# Patient Record
Sex: Female | Born: 1980 | Race: White | Hispanic: No | Marital: Married | Smoking: Never smoker
Health system: Southern US, Community
[De-identification: ages and names within clinical notes are randomized; demographics above are authoritative.]

## PROBLEM LIST (undated history)

## (undated) DIAGNOSIS — N859 Noninflammatory disorder of uterus, unspecified: Secondary | ICD-10-CM

## (undated) DIAGNOSIS — R000 Tachycardia, unspecified: Secondary | ICD-10-CM

## (undated) DIAGNOSIS — N809 Endometriosis, unspecified: Secondary | ICD-10-CM

## (undated) DIAGNOSIS — R519 Headache, unspecified: Secondary | ICD-10-CM

## (undated) DIAGNOSIS — E282 Polycystic ovarian syndrome: Secondary | ICD-10-CM

## (undated) DIAGNOSIS — R51 Headache: Secondary | ICD-10-CM

## (undated) DIAGNOSIS — E0590 Thyrotoxicosis, unspecified without thyrotoxic crisis or storm: Secondary | ICD-10-CM

## (undated) DIAGNOSIS — O24419 Gestational diabetes mellitus in pregnancy, unspecified control: Secondary | ICD-10-CM

## (undated) DIAGNOSIS — Z9109 Other allergy status, other than to drugs and biological substances: Secondary | ICD-10-CM

## (undated) DIAGNOSIS — N949 Unspecified condition associated with female genital organs and menstrual cycle: Secondary | ICD-10-CM

## (undated) DIAGNOSIS — O139 Gestational [pregnancy-induced] hypertension without significant proteinuria, unspecified trimester: Secondary | ICD-10-CM

## (undated) HISTORY — DX: Gestational (pregnancy-induced) hypertension without significant proteinuria, unspecified trimester: O13.9

## (undated) HISTORY — PX: CHOLECYSTECTOMY: SHX55

## (undated) HISTORY — PX: LAPAROSCOPIC ENDOMETRIOSIS FULGURATION: SUR769

## (undated) HISTORY — DX: Gestational diabetes mellitus in pregnancy, unspecified control: O24.419

## (undated) HISTORY — DX: Endometriosis, unspecified: N80.9

## (undated) HISTORY — DX: Polycystic ovarian syndrome: E28.2

## (undated) HISTORY — DX: Thyrotoxicosis, unspecified without thyrotoxic crisis or storm: E05.90

## (undated) HISTORY — PX: WISDOM TOOTH EXTRACTION: SHX21

## (undated) HISTORY — DX: Other allergy status, other than to drugs and biological substances: Z91.09

---
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
## 2007-02-22 ENCOUNTER — Ambulatory Visit: Payer: Self-pay | Admitting: Family Medicine

## 2007-12-25 ENCOUNTER — Ambulatory Visit: Payer: Self-pay | Admitting: Internal Medicine

## 2008-08-02 ENCOUNTER — Ambulatory Visit: Payer: Self-pay | Admitting: Internal Medicine

## 2010-05-02 ENCOUNTER — Ambulatory Visit: Payer: Self-pay | Admitting: Internal Medicine

## 2010-11-24 NOTE — Assessment & Plan Note (Signed)
[facility]                            CARDIOLOGY OFFICE NOTE   NAME:BLACKWELL, RASHELLE                    MRN:          [id number]  DATE:08/02/2008                            DOB:          [birth year]    IDENTIFICATION:  Ms. Amie Critchley is a 30 year old woman with a history of  palpitations, question SVT, also history of migraine headaches.  I last  saw her in June.   The patient since then has done well.  She notes occasional palpitations  that are very short lived, no associated dizziness.  She is active,  notes no problems with this.  No episodes of syncope.   CURRENT MEDICATIONS:  1. Topamax 300 daily, 400 around the time of her menses.  2. Zyrtec daily.  3. Yasmin daily.   PHYSICAL EXAMINATION:  GENERAL:  On exam, the patient is in no distress.  VITAL SIGNS:  Blood pressure lying 108/72, pulse 68; sitting 114/77,  pulse 72; and standing 0 minutes 109/76, pulse 74; at 2 minutes 107/73,  pulse 74; 5 minutes 105/73 pulse 82.  The patient asymptomatic.  LUNGS:  Clear.  NECK:  JVP is normal.  CARDIAC:  Regular rate and rhythm, S1 and S2.  No S3, S4, or murmurs.  ABDOMEN:  Benign.  EXTREMITIES:  No edema.   IMPRESSION:  1. Palpitations may indeed be short burst of supraventricular      tachycardia.  Overall, she seems to be tolerated.  There was a      question one of the monitors of an SVT.  Still does not appear to      be destabilizing in anyway.  2. If her symptoms change, I will be happy to see her again.  We would      set her up for an event monitor if things get worse.  For now,      though I would continue on as she is doing.  I will set a followup      only as needed.  She will call.     Pricilla Riffle, MD, [facility]  Electronically Signed    PVR/MedQ  DD: 08/02/2008  DT: 08/03/2008  Job #: [id number]   cc:   Alvin Critchley, M.D.  Leim Fabry, MD

## 2010-11-24 NOTE — Assessment & Plan Note (Signed)
[facility]                            CARDIOLOGY OFFICE NOTE   NAME:BLACKWELL, BYANCA                    MRN:          [id number]  DATE:12/25/2007                            DOB:          1981-02-14    IDENTIFICATION:  [name] is a 30 year old woman whom I have seen  in the past.  She actually was previously followed by Lorna Few for  palpitations, question SVT. I last saw her back in 2003.   In the interval, [facility]  She was seen  by a PA of Dr. Onnie Boer and during this checkup was told she had an  irregular pulse and that she should follow up with a cardiologist. She  had follow up with a primary after this and was unable to hear the  irregularity, but again keeps her followup.   On talking to the patient, she still has spells that last very short,  about a minute.  Usually when she is changing positions, she will feel  her heart race.  Again, also when she is about to sneeze.  Less than a  minute. A couple months ago, she had one spell the last one longest  episode about 1-2 hours.  She felt tired after and a little dizzy.  She  tries to avoid caffeine.   CURRENT MEDICATIONS:  1. Topamax 300.  2. Zyrtec.  3. Yasmin contraceptive.  4. Singulair p.r.n.   PHYSICAL EXAM:  The patient is in no distress.  Blood pressure 125/73,  pulse 92 and regular, weight 122.  LUNGS:  Clear.  CARDIAC:  Regular rate and rhythm. S1-S2, no S3.  ABDOMEN: Abdomen is benign.  EXTREMITIES:  No edema.   IMPRESSION:  Tachycardia.  Review of Gar Ponto note, it was  difficult. She did not have a lot of strips to show the tachycardia.  It  was presumed SVT. She still has only occasional spells. I am not  convinced she is that symptomatic.  I have given her a prescription for  atenolol.  If she has a long spell she can take a half and then one.  Note, she is due to take a mission trip to [place].   Otherwise,  I do not think any other intervention is needed at present.  She will call if her symptoms worsen.  Otherwise, I will set to see her  back in the winter.   ADDENDUM:  12-lead EKG normal sinus rhythm 88 beats per minute.     Pricilla Riffle, MD, [facility]  Electronically Signed    PVR/MedQ  DD: 12/25/2007  DT: 12/25/2007  Job #: [id number]   cc:   Santiago Glad

## 2011-06-26 ENCOUNTER — Ambulatory Visit: Payer: Self-pay | Admitting: Internal Medicine

## 2014-02-03 ENCOUNTER — Ambulatory Visit: Payer: Self-pay | Admitting: Internal Medicine

## 2014-07-27 ENCOUNTER — Emergency Department: Payer: Self-pay | Admitting: Physician Assistant

## 2014-12-29 ENCOUNTER — Ambulatory Visit
Admission: EM | Admit: 2014-12-29 | Discharge: 2014-12-29 | Disposition: A | Discharge status: Home / Self Care | Payer: BLUE CROSS/BLUE SHIELD | Attending: Family Medicine | Admitting: Family Medicine

## 2014-12-29 ENCOUNTER — Encounter: Payer: Self-pay | Admitting: Emergency Medicine

## 2014-12-29 DIAGNOSIS — R110 Nausea: Secondary | ICD-10-CM | POA: Diagnosis not present

## 2014-12-29 DIAGNOSIS — R197 Diarrhea, unspecified: Secondary | ICD-10-CM | POA: Diagnosis not present

## 2014-12-29 DIAGNOSIS — Z3201 Encounter for pregnancy test, result positive: Secondary | ICD-10-CM | POA: Insufficient documentation

## 2014-12-29 HISTORY — DX: Headache: R51

## 2014-12-29 HISTORY — DX: Unspecified condition associated with female genital organs and menstrual cycle: N94.9

## 2014-12-29 HISTORY — DX: Headache, unspecified: R51.9

## 2014-12-29 HISTORY — DX: Tachycardia, unspecified: R00.0

## 2014-12-29 HISTORY — DX: Noninflammatory disorder of uterus, unspecified: N85.9

## 2014-12-29 LAB — CBC WITH DIFFERENTIAL/PLATELET
BASOS ABS: 0 10*3/uL (ref 0–0.1)
Basophils Relative: 1 %
EOS PCT: 2 %
Eosinophils Absolute: 0.2 10*3/uL (ref 0–0.7)
HCT: 42.4 % (ref 35.0–47.0)
Hemoglobin: 14.4 g/dL (ref 12.0–16.0)
LYMPHS ABS: 2.7 10*3/uL (ref 1.0–3.6)
LYMPHS PCT: 27 %
MCH: 29.4 pg (ref 26.0–34.0)
MCHC: 34 g/dL (ref 32.0–36.0)
MCV: 86.3 fL (ref 80.0–100.0)
Monocytes Absolute: 0.6 10*3/uL (ref 0.2–0.9)
Monocytes Relative: 6 %
Neutro Abs: 6.5 10*3/uL (ref 1.4–6.5)
Neutrophils Relative %: 64 %
Platelets: 200 10*3/uL (ref 150–440)
RBC: 4.91 MIL/uL (ref 3.80–5.20)
RDW: 12.5 % (ref 11.5–14.5)
WBC: 10 10*3/uL (ref 3.6–11.0)

## 2014-12-29 LAB — URINALYSIS COMPLETE WITH MICROSCOPIC (ARMC ONLY)
BILIRUBIN URINE: NEGATIVE
Bacteria, UA: NONE SEEN — AB
Glucose, UA: NEGATIVE mg/dL
KETONES UR: NEGATIVE mg/dL
Leukocytes, UA: NEGATIVE
NITRITE: NEGATIVE
PROTEIN: NEGATIVE mg/dL
RBC / HPF: NONE SEEN RBC/hpf (ref <3)
Specific Gravity, Urine: 1.01 (ref 1.005–1.030)
pH: 5.5 (ref 5.0–8.0)

## 2014-12-29 LAB — BASIC METABOLIC PANEL
ANION GAP: 8 (ref 5–15)
BUN: 7 mg/dL (ref 6–20)
CO2: 26 mmol/L (ref 22–32)
CREATININE: 0.75 mg/dL (ref 0.44–1.00)
Calcium: 9.1 mg/dL (ref 8.9–10.3)
Chloride: 104 mmol/L (ref 101–111)
GLUCOSE: 96 mg/dL (ref 65–99)
Potassium: 3.9 mmol/L (ref 3.5–5.1)
Sodium: 138 mmol/L (ref 135–145)

## 2014-12-29 LAB — HCG, QUANTITATIVE, PREGNANCY: hCG, Beta Chain, Quant, S: 6 m[IU]/mL — ABNORMAL HIGH (ref <5)

## 2014-12-29 MED ORDER — ONDANSETRON 8 MG PO TBDP
8.0000 mg | ORAL_TABLET | Freq: Once | ORAL | Status: AC
  Administered 2014-12-29: 8 mg via ORAL

## 2014-12-29 MED ORDER — DOXYLAMINE-PYRIDOXINE 10-10 MG PO TBEC: 2.0000 | DELAYED_RELEASE_TABLET | Freq: Every evening | ORAL | Status: DC | PRN

## 2014-12-29 NOTE — ED Notes (Signed)
Diarrhea,  Gas, nauseated, headache since Wednesday.

## 2014-12-29 NOTE — Discharge Instructions (Signed)
Eating Plan for Hyperemesis Gravidarum  Severe cases of hyperemesis gravidarum can lead to dehydration and malnutrition. The hyperemesis eating plan is one way to lessen the symptoms of nausea and vomiting. It is often used with prescribed medicines to control your symptoms.   WHAT CAN I DO TO RELIEVE MY SYMPTOMS?  Listen to your body. Everyone is different and has different preferences. Find what works best for you. Some of the following things may help:  · Eat and drink slowly.  · Eat 5-6 small meals daily instead of 3 large meals.    · Eat crackers before you get out of bed in the morning.    · Starchy foods are usually well tolerated (such as cereal, toast, bread, potatoes, pasta, rice, and pretzels).    · Ginger may help with nausea. Add ¼ tsp ground ginger to hot tea or choose ginger tea.    · Try drinking 100% fruit juice or an electrolyte drink.  · Continue to take your prenatal vitamins as directed by your health care provider. If you are having trouble taking your prenatal vitamins, talk with your health care provider about different options.  · Include at least 1 serving of protein with your meals and snacks (such as meats or poultry, beans, nuts, eggs, or yogurt). Try eating a protein-rich snack before bed (such as cheese and crackers or a half turkey or peanut butter sandwich).  WHAT THINGS SHOULD I AVOID TO REDUCE MY SYMPTOMS?  The following things may help reduce your symptoms:  · Avoid foods with strong smells. Try eating meals in well-ventilated areas that are free of odors.  · Avoid drinking water or other beverages with meals. Try not to drink anything less than 30 minutes before and after meals.  · Avoid drinking more than 1 cup of fluid at a time.  · Avoid fried or high-fat foods, such as butter and cream sauces.  · Avoid spicy foods.  · Avoid skipping meals the best you can. Nausea can be more intense on an empty stomach. If you cannot tolerate food at that time, do not force it. Try sucking on  ice chips or other frozen items and make up the calories later.  · Avoid lying down within 2 hours after eating.  Document Released: 04/25/2007 Document Revised: 07/03/2013 Document Reviewed: 05/02/2013  Exit[employer]® Patient Information ©2015 [employer]. This information is not intended to replace advice given to you by your health care provider. Make sure you discuss any questions you have with your health care provider.

## 2014-12-29 NOTE — ED Provider Notes (Signed)
CSN: [id number]     Arrival date & time 12/29/14  0802 History   None    Chief Complaint  Patient presents with  . Nausea   (Consider location/radiation/quality/duration/timing/severity/associated sxs/prior Treatment) HPI Developed nausea after lunch Wed (4days ago) -no coworkers sick- on and off over last 4 days. Often starts day out feeling well and develops diarrhea or nausea as day progresses. Tolerated soup well on Friday night. Went out for salads last night and developed gas cramps and diarrhea again during night. Denies any fever. Mild malaise and fatigue. Took BCP break a few weeks ago, lag in Rx., no back up. Hx of continual dosing and no periods. Has started back. Reports using back up protection during interim only. Seasonal allegies - also not taking her Zyrtec right now  Past Medical History  Diagnosis Date  . Endometrial disorder   . Tachycardia   . Headache    Past Surgical History  Procedure Laterality Date  . Laparoscopic endometriosis fulguration     History reviewed. No pertinent family history. History  Substance Use Topics  . Smoking status: Never Smoker   . Smokeless tobacco: Never Used  . Alcohol Use: No   OB History    No data available     Review of Systems Review of 10 systems negative for acute change except as referenced in HPI  Allergies  Decongest-aid and Codeine  Home Medications   Prior to Admission medications   Medication Sig Start Date End Date Taking? Authorizing Provider  cetirizine (ZYRTEC) 10 MG tablet Take 10 mg by mouth daily.   Yes Historical Provider, MD  drospirenone-ethinyl estradiol (YASMIN,ZARAH,SYEDA) 3-0.03 MG tablet Take 1 tablet by mouth daily.   Yes Historical Provider, MD  naproxen (NAPROSYN) 250 MG tablet Take 250 mg by mouth 2 (two) times daily with a meal.   Yes Historical Provider, MD  Doxylamine-Pyridoxine 10-10 MG TBEC Take 2 tablets by mouth at bedtime as needed and may repeat dose one time if needed. 12/29/14    Rae Halsted, PA-C   BP 120/89 mmHg  Pulse 86  Temp(Src) 98.3 F (36.8 C) (Oral)  Resp 16  Ht 5\' 3"  (1.6 m)  Wt 140 lb (63.504 kg)  BMI 24.81 kg/m2  SpO2 100%  LMP 12/04/2014 Physical Exam Constitutional -alert and oriented,well appearing, quiqet- no acute distress Head-atraumatic Eyes- conjunctiva normal, EOMI ,conjugate gaze Ears- canasl and TMs neg Nose- no congestion or rhinorrhea Mouth/throat- mucous membranes moist  Neck- supple without glandular enlargement CV- regular rate, grossly normal heart sounds,  Resp-no distress, normal respiratory effort,clear to auscultation bilaterally GI- soft,non-tender,no distention, increased bowel activity GU- not examined MSK- no lower extremity tenderness nor edema,no joint effusion,  Neuro- normal speech and language,  Skin-warm,dry ,intact; no rash noted Psych-mood and affect grossly normal; speech and behavior grossly normal ED Course  Procedures (including critical care time) Labs Review Labs Reviewed  URINALYSIS COMPLETEWITH MICROSCOPIC ([facility] ONLY) - Abnormal; Notable for the following:    Hgb urine dipstick TRACE (*)    Bacteria, UA NONE SEEN (*)    Squamous Epithelial / LPF 6-30 (*)    All other components within normal limits  HCG, QUANTITATIVE, PREGNANCY - Abnormal; Notable for the following:    hCG, Beta Chain, Quant, S 6 (*)    All other components within normal limits  BASIC METABOLIC PANEL  CBC WITH DIFFERENTIAL/PLATELET   Results for orders placed or performed during the hospital encounter of 12/29/14  Urinalysis complete, with microscopic  Result  Value Ref Range   Color, Urine YELLOW YELLOW   APPearance CLEAR CLEAR   Glucose, UA NEGATIVE NEGATIVE mg/dL   Bilirubin Urine NEGATIVE NEGATIVE   Ketones, ur NEGATIVE NEGATIVE mg/dL   Specific Gravity, Urine 1.010 1.005 - 1.030   Hgb urine dipstick TRACE (A) NEGATIVE   pH 5.5 5.0 - 8.0   Protein, ur NEGATIVE NEGATIVE mg/dL   Nitrite NEGATIVE NEGATIVE    Leukocytes, UA NEGATIVE NEGATIVE   RBC / HPF NONE SEEN <3 RBC/hpf   WBC, UA 0-5 <3 WBC/hpf   Bacteria, UA NONE SEEN (A) RARE   Squamous Epithelial / LPF 6-30 (A) RARE  hCG, quantitative, pregnancy  Result Value Ref Range   hCG, Beta Chain, Quant, S 6 (H) <5 mIU/mL  Basic metabolic panel  Result Value Ref Range   Sodium 138 135 - 145 mmol/L   Potassium 3.9 3.5 - 5.1 mmol/L   Chloride 104 101 - 111 mmol/L   CO2 26 22 - 32 mmol/L   Glucose, Bld 96 65 - 99 mg/dL   BUN 7 6 - 20 mg/dL   Creatinine, Ser 1.61 0.44 - 1.00 mg/dL   Calcium 9.1 8.9 - 09.6 mg/dL   GFR calc non Af Amer >60 >60 mL/min   GFR calc Af Amer >60 >60 mL/min   Anion gap 8 5 - 15  CBC with Differential  Result Value Ref Range   WBC 10.0 3.6 - 11.0 K/uL   RBC 4.91 3.80 - 5.20 MIL/uL   Hemoglobin 14.4 12.0 - 16.0 g/dL   HCT 04.5 40.9 - 81.1 %   MCV 86.3 80.0 - 100.0 fL   MCH 29.4 26.0 - 34.0 pg   MCHC 34.0 32.0 - 36.0 g/dL   RDW 91.4 78.2 - 95.6 %   Platelets 200 150 - 440 K/uL   Neutrophils Relative % 64 %   Neutro Abs 6.5 1.4 - 6.5 K/uL   Lymphocytes Relative 27 %   Lymphs Abs 2.7 1.0 - 3.6 K/uL   Monocytes Relative 6 %   Monocytes Absolute 0.6 0.2 - 0.9 K/uL   Eosinophils Relative 2 %   Eosinophils Absolute 0.2 0 - 0.7 K/uL   Basophils Relative 1 %   Basophils Absolute 0.0 0 - 0.1 K/uL   Imaging Review No results found.  Medications  ondansetron (ZOFRAN-ODT) disintegrating tablet 8 mg (8 mg Oral Given 12/29/14 0945)  Relief of nausea experienced MDM   1. Nausea   2. Diarrhea    Very early positive pregnancy test discussed with patient and her husband. They  are astounded and can't identify when they were together without protection. Recommend a 3 day repeat BHCG for confirmation. Reevalauate GI distress at that time. Pregnancy precautions- no medication, alcohol, drugs,saunas. Discharge Medication List as of 12/29/2014 10:39 AM    START taking these medications   Details  Doxylamine-Pyridoxine  10-10 MG TBEC Take 2 tablets by mouth at bedtime as needed and may repeat dose one time if needed., Starting 12/29/2014, Until Discontinued, Print      Diagnosis and treatment discussed. . Questions fielded, expectations and recommendations reviewed. Patient expresses understanding. Will return to [facility] with questions, concern or exacerbation. Has Rx for nausea and dosing schedule reviewed. Will see OB-Gyn in next few days   Rae Halsted, PA-C 12/31/14 0001

## 2014-12-30 ENCOUNTER — Encounter: Payer: Self-pay | Admitting: Physician Assistant

## 2016-03-17 ENCOUNTER — Ambulatory Visit (INDEPENDENT_AMBULATORY_CARE_PROVIDER_SITE_OTHER): Payer: BLUE CROSS/BLUE SHIELD | Admitting: Obstetrics and Gynecology

## 2016-03-17 ENCOUNTER — Encounter: Payer: Self-pay | Admitting: Obstetrics and Gynecology

## 2016-03-17 VITALS — BP 136/88 | HR 84 | Ht 63.0 in | Wt 151.4 lb

## 2016-03-17 DIAGNOSIS — N809 Endometriosis, unspecified: Secondary | ICD-10-CM | POA: Insufficient documentation

## 2016-03-17 DIAGNOSIS — N970 Female infertility associated with anovulation: Secondary | ICD-10-CM | POA: Diagnosis not present

## 2016-03-17 DIAGNOSIS — L709 Acne, unspecified: Secondary | ICD-10-CM | POA: Diagnosis not present

## 2016-03-17 DIAGNOSIS — E282 Polycystic ovarian syndrome: Secondary | ICD-10-CM

## 2016-03-17 DIAGNOSIS — J309 Allergic rhinitis, unspecified: Secondary | ICD-10-CM | POA: Insufficient documentation

## 2016-03-17 DIAGNOSIS — N803 Endometriosis of pelvic peritoneum: Secondary | ICD-10-CM | POA: Insufficient documentation

## 2016-03-17 DIAGNOSIS — Z0000 Encounter for general adult medical examination without abnormal findings: Secondary | ICD-10-CM

## 2016-03-17 DIAGNOSIS — F419 Anxiety disorder, unspecified: Secondary | ICD-10-CM

## 2016-03-17 DIAGNOSIS — IMO0002 Reserved for concepts with insufficient information to code with codable children: Secondary | ICD-10-CM | POA: Insufficient documentation

## 2016-03-17 DIAGNOSIS — Z01419 Encounter for gynecological examination (general) (routine) without abnormal findings: Secondary | ICD-10-CM

## 2016-03-17 NOTE — Progress Notes (Signed)
ANNUAL PREVENTATIVE CARE GYN  ENCOUNTER NOTE  Subjective:       [facility] is a 35 y.o. G0P0 female here for a routine annual gynecologic exam.  Current complaints: 1.  Trying to get pregnant   Irregular cycles- infrequent  History of endometriosis diagnosed at age 35 via laparoscopy; Dr KinciusPerformed Ablation of Endometriosis Status post 6 months of Depo-Lupron therapy Status post years of cyclic birth control pill therapy Status post recent continuous OCP therapy for several years.  Oligomenorrhea since discontinuing OCPs in December 2016. 2 cycles since discontinuing OCPs. The patient has noticed increased acne, increased dryness of skin as well as some increased hair growth since stopping birth control pills.  Gynecologic History Patient's last menstrual period was 03/10/2016 (exact date). Contraception: none Last Pap: 01/2014 n/n. Results were: normal Last mammogram: n/a. Results were: n/a  Obstetric History OB History  Gravida Para Term Preterm AB Living  0            SAB TAB Ectopic Multiple Live Births                   Past Medical History:  Diagnosis Date  . Endometrial disorder   . Endometriosis   . Environmental allergies   . Headache   . Tachycardia     Past Surgical History:  Procedure Laterality Date  . LAPAROSCOPIC ENDOMETRIOSIS FULGURATION    . WISDOM TOOTH EXTRACTION      Current Outpatient Prescriptions on File Prior to Visit  Medication Sig Dispense Refill  . cetirizine (ZYRTEC) 10 MG tablet Take 10 mg by mouth daily.    . naproxen (NAPROSYN) 250 MG tablet Take 250 mg by mouth 2 (two) times daily with a meal.     No current facility-administered medications on file prior to visit.     Allergies  Allergen Reactions  . Decongest-Aid [Pseudoephedrine] Anaphylaxis    tachycardia  . Codeine Nausea And Vomiting    Social History   Social History  . Marital status: Married    Spouse name: N/A  . Number of children: N/A   . Years of education: N/A   Occupational History  . Not on file.   Social History Main Topics  . Smoking status: Never Smoker  . Smokeless tobacco: Never Used  . Alcohol use No  . Drug use: No  . Sexual activity: Yes    Birth control/ protection: Pill   Other Topics Concern  . Not on file   Social History Narrative  . No narrative on file    No family history on file.  The following portions of the patient's history were reviewed and updated as appropriate: allergies, current medications, past family history, past medical history, past social history, past surgical history and problem list.  Review of Systems ROS Review of Systems - General ROS: negative for - chills, fatigue, fever, hot flashes, night sweats, weight gain or weight loss Psychological ROS: negative for - anxiety, decreased libido, depression, mood swings, physical abuse or sexual abuse Ophthalmic ROS: negative for - blurry vision, eye pain or loss of vision ENT ROS: negative for - headaches, hearing change, visual changes or vocal changes Allergy and Immunology ROS: negative for - hives, itchy/watery eyes or seasonal allergies Hematological and Lymphatic ROS: negative for - bleeding problems, bruising, swollen lymph nodes or weight loss Endocrine ROS: negative for - galactorrhea, hair pattern changes, hot flashes, malaise/lethargy, mood swings, palpitations, polydipsia/polyuria, skin changes, temperature intolerance or unexpected weight changes Breast ROS: negative  for - new or changing breast lumps or nipple discharge Respiratory ROS: negative for - cough or shortness of breath Cardiovascular ROS: negative for - chest pain, irregular heartbeat, palpitations or shortness of breath Gastrointestinal ROS: no abdominal pain, change in bowel habits, or black or bloody stools Genito-Urinary ROS: no dysuria, trouble voiding, or hematuria Musculoskeletal ROS: negative for - joint pain or joint stiffness Neurological  ROS: negative for - bowel and bladder control changes Dermatological ROS: negative for rash and skin lesion changes   Objective:   BP 136/88   Pulse 84   Ht 5\' 3"  (1.6 m)   Wt 151 lb 6.4 oz (68.7 kg)   LMP 03/10/2016 (Exact Date)   BMI 26.82 kg/m  CONSTITUTIONAL: Well-developed, well-nourished female in no acute distress.  PSYCHIATRIC: Normal mood and affect. Normal behavior. Normal judgment and thought content. NEUROLGIC: Alert and oriented to person, place, and time. Normal muscle tone coordination. No cranial nerve deficit noted. HENT:  Normocephalic, atraumatic, External right and left ear normal. Oropharynx is clear and moist EYES: Conjunctivae and EOM are normal. Pupils are equal, round, and reactive to light. No scleral icterus.  NECK: Normal range of motion, supple, no masses.  Normal thyroid.  SKIN: Skin is warm and dry. No rash noted. Not diaphoretic. No erythema. No pallor. CARDIOVASCULAR: Normal heart rate noted, regular rhythm, no murmur. RESPIRATORY: Clear to auscultation bilaterally. Effort and breath sounds normal, no problems with respiration noted. BREASTS: Symmetric in size. No masses, skin changes, nipple drainage, or lymphadenopathy. ABDOMEN: Soft, normal bowel sounds, no distention noted.  No tenderness, rebound or guarding.  BLADDER: Normal PELVIC:  External Genitalia: Normal  BUS: Normal  Vagina: Normal  Cervix: Normal; nulliparous; no cervical motion tenderness  Uterus: Normal; midplane, nontender, mobile  Adnexa: Normal; nonpalpable nontender  RV: External Exam NormaI  MUSCULOSKELETAL: Normal range of motion. No tenderness.  No cyanosis, clubbing, or edema.  2+ distal pulses. LYMPHATIC: No Axillary, Supraclavicular, or Inguinal Adenopathy.  PH Q-9 questionnaire demonstrated mild anxiety symptoms  Assessment:   Annual gynecologic examination 35 y.o. 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 Anxiety  Plan:  Pap: due 2018 Mammogram: Not Indicated Stool Guaiac Testing:  Not Indicated Labs: thru pcp Routine preventative health maintenance measures emphasized: Exercise/Diet/Weight control, Tobacco Warnings, Alcohol/Substance use risks and Stress Management PH Q-9 questionnaire completed Recommend psychology counseling Laboratory work: Free and total testosterone, fasting blood sugar, hemoglobin A1c, lipid panel, vitamin D level, rubella, varicella, day 21 serum progesterone Return to clinic 1 week after lab work to be obtained on 04/01/2016 0.4 mg folic acid daily in multivitamin or prenatal vitamin Return to Clinic - 1 Year   Crystal Rhinecliff, CMA Herold Harms, MD  Note: This dictation was prepared with Dragon dictation along with smaller phrase technology. Any transcriptional errors that result from this process are unintentional.

## 2016-03-17 NOTE — Patient Instructions (Signed)
1. No Pap smear necessary 2.Self breast awareged 3. Healthy eating with exercis

## 2016-04-01 ENCOUNTER — Other Ambulatory Visit: Payer: BLUE CROSS/BLUE SHIELD

## 2016-04-02 LAB — LIPID PANEL
CHOL/HDL RATIO: 4.4 ratio (ref 0.0–4.4)
Cholesterol, Total: 270 mg/dL — ABNORMAL HIGH (ref 100–199)
HDL: 61 mg/dL (ref >39)
LDL Calculated: 184 mg/dL — ABNORMAL HIGH (ref 0–99)
Triglycerides: 126 mg/dL (ref 0–149)
VLDL Cholesterol Cal: 25 mg/dL (ref 5–40)

## 2016-04-02 LAB — RUBELLA SCREEN: Rubella Antibodies, IGG: 2.53 index (ref >0.99)

## 2016-04-03 LAB — PROGESTERONE: PROGESTERONE: 0.3 ng/mL

## 2016-04-03 LAB — HEMOGLOBIN A1C
Est. average glucose Bld gHb Est-mCnc: 105 mg/dL
HEMOGLOBIN A1C: 5.3 % (ref 4.8–5.6)

## 2016-04-03 LAB — TESTOSTERONE,FREE AND TOTAL
TESTOSTERONE FREE: 1.6 pg/mL (ref 0.0–4.2)
TESTOSTERONE: 44 ng/dL (ref 8–48)

## 2016-04-03 LAB — TSH: TSH: 2.12 u[IU]/mL (ref 0.450–4.500)

## 2016-04-03 LAB — VARICELLA ZOSTER ANTIBODY, IGG: VARICELLA: 907 {index} (ref >165)

## 2016-04-03 LAB — PROLACTIN: PROLACTIN: 9.3 ng/mL (ref 4.8–23.3)

## 2016-04-03 LAB — GLUCOSE, RANDOM: GLUCOSE: 81 mg/dL (ref 65–99)

## 2016-04-05 ENCOUNTER — Telehealth: Payer: Self-pay

## 2016-04-05 NOTE — Telephone Encounter (Signed)
lmtrc

## 2016-04-05 NOTE — Telephone Encounter (Signed)
Pt aware.

## 2016-04-05 NOTE — Telephone Encounter (Signed)
-----   Message from Herold HarmsMartin A Defrancesco, MD sent at 04/04/2016  9:43 PM EDT ----- Please notify - Abnormal Labs Total and LDL cholesterol are high; HDL is good. Recommend Healthy eating and exercise and recheck Lipid 1 in 1 year.  Serum progesterone level is ANOVULATORY. All other labs are normal.

## 2016-04-08 ENCOUNTER — Ambulatory Visit: Payer: BLUE CROSS/BLUE SHIELD | Admitting: Obstetrics and Gynecology

## 2016-04-08 ENCOUNTER — Ambulatory Visit (INDEPENDENT_AMBULATORY_CARE_PROVIDER_SITE_OTHER): Payer: BLUE CROSS/BLUE SHIELD | Admitting: Obstetrics and Gynecology

## 2016-04-08 VITALS — BP 126/79 | HR 88 | Ht 63.0 in | Wt 151.1 lb

## 2016-04-08 DIAGNOSIS — N970 Female infertility associated with anovulation: Secondary | ICD-10-CM

## 2016-04-08 DIAGNOSIS — N809 Endometriosis, unspecified: Secondary | ICD-10-CM

## 2016-04-08 DIAGNOSIS — E282 Polycystic ovarian syndrome: Secondary | ICD-10-CM

## 2016-04-08 MED ORDER — MEDROXYPROGESTERONE ACETATE 10 MG PO TABS: 10.0000 mg | ORAL_TABLET | Freq: Every day | ORAL | 2 refills | Status: DC

## 2016-04-08 MED ORDER — CLOMIPHENE CITRATE 50 MG PO TABS: 50.0000 mg | ORAL_TABLET | Freq: Every day | ORAL | 0 refills | Status: DC

## 2016-04-08 NOTE — Patient Instructions (Signed)
1. Take Provera 10 mg a day for 10 days. 2. Once bleeding starts, began Clomid therapy 50 mg a day on days 5 through 9 3. Have timed intercourse on days 12, 14, and 16 4. Return for a day 22 serum  progesterone level check on day 22 of your cycle 5. Perform a urine pregnancy test on day 30 of  Cycle 6. Return in 6 weeks for follow-up

## 2016-04-08 NOTE — Progress Notes (Signed)
Chief complaint: 1. Primary infertility 2. PCO 3. Oligo ovulation 4. History of endometriosis  Patient presents for follow-up on blood work and further management planning regarding infertility. Date 22 progesterone level-0.3 ANOVULATORY Rubella immune TSH normal Varicella immune Hemoglobin A1c-normal Passing blood sugar-normal Prolactin level-normal Testosterone levels, free and total-normal  Past medical history, past surgical history, problem list, medications, and allergies are reviewed  OBJECTIVE: BP 126/79   Pulse 88   Ht 5\' 3"  (1.6 m)   Wt 151 lb 1.6 oz (68.5 kg)   LMP 03/10/2016 (Exact Date)   BMI 26.77 kg/m  Physical exam-deferred  ASSESSMENT: 1. Primary infertility, anovulatory 2. PCO 3. History of endometriosis  PLAN: 1. Take Provera 10 mg a day for 10 days. 2. Once bleeding starts, began Clomid therapy 50 mg a day on days 5 through 9 3. Have timed intercourse on days 12, 14, and 16 4. Return for a day 22 serum  progesterone level check on day 22 of your cycle 5. Perform a urine pregnancy test on day 30 of  Cycle 6. Return in 6 weeks for follow-up  A total of 25 minutes were spent face-to-face with the patient during this encounter and over half of that time involved counseling and coordination of care.  Herold HarmsMartin A Defrancesco, MD  Note: This dictation was prepared with Dragon dictation along with smaller phrase technology. Any transcriptional errors that result from this process are unintentional.

## 2016-05-07 ENCOUNTER — Encounter: Payer: Self-pay | Admitting: Obstetrics and Gynecology

## 2016-05-17 ENCOUNTER — Other Ambulatory Visit: Payer: BLUE CROSS/BLUE SHIELD

## 2016-05-18 LAB — PROGESTERONE: Progesterone: 10 ng/mL

## 2016-05-19 ENCOUNTER — Ambulatory Visit (INDEPENDENT_AMBULATORY_CARE_PROVIDER_SITE_OTHER): Payer: BLUE CROSS/BLUE SHIELD | Admitting: Obstetrics and Gynecology

## 2016-05-19 VITALS — BP 146/89 | HR 84 | Ht 63.0 in | Wt 154.3 lb

## 2016-05-19 DIAGNOSIS — N809 Endometriosis, unspecified: Secondary | ICD-10-CM

## 2016-05-19 DIAGNOSIS — N970 Female infertility associated with anovulation: Secondary | ICD-10-CM

## 2016-05-19 DIAGNOSIS — E282 Polycystic ovarian syndrome: Secondary | ICD-10-CM

## 2016-05-19 NOTE — Progress Notes (Signed)
Chief complaint: 1. Oligo ovulation 2. PCO  Patient presents for follow-up on Clomid ovulation induction. This first cycle of Clomid 50 mg a core latex with a serum progesterone level of 10-consistent with ovulation. She is currently on day 25 of her cycle.  OBJECTIVE: BP (!) 146/89   Pulse 84   Ht 5\' 3"  (1.6 m)   Wt 154 lb 4.8 oz (70 kg)   LMP 04/24/2016 (Approximate)   BMI 27.33 kg/m  Physical exam-deferred  ASSESSMENT: 1. Oligo ovulation 2. Serum progesterone level consistent with ovulation (on 50 mg of Clomid)  PLAN: 1. Return on 05/24/2016 for UPT; if UPT is negative, will give patient Provera 10 mg a day for 10 days to induce menses and restart Clomid ovulation induction with 50 mg a day of Clomid days 5 through 9.  A total of 15 minutes were spent face-to-face with the patient during this encounter and over half of that time dealt with counseling and coordination of care.  Herold HarmsMartin A Aquila Delaughter, MD  Note: This dictation was prepared with Dragon dictation along with smaller phrase technology. Any transcriptional errors that result from this process are unintentional.

## 2016-05-19 NOTE — Patient Instructions (Signed)
1. Return to clinic on 05/24/2016 for UPT. 2. If UPT is negative, Provera 10 mg a day for 10 days will be given to start menses 3. Will continue with Clomid ovulation induction 50 mg a day days 5 through 9 as discussed

## 2016-05-20 ENCOUNTER — Ambulatory Visit: Payer: BLUE CROSS/BLUE SHIELD | Admitting: Obstetrics and Gynecology

## 2016-05-21 ENCOUNTER — Encounter: Payer: Self-pay | Admitting: Obstetrics and Gynecology

## 2016-05-23 ENCOUNTER — Other Ambulatory Visit: Payer: Self-pay | Admitting: Obstetrics and Gynecology

## 2016-05-24 ENCOUNTER — Other Ambulatory Visit: Payer: BLUE CROSS/BLUE SHIELD

## 2016-07-18 ENCOUNTER — Encounter: Payer: Self-pay | Admitting: Obstetrics and Gynecology

## 2016-07-18 ENCOUNTER — Other Ambulatory Visit: Payer: Self-pay | Admitting: Obstetrics and Gynecology

## 2016-07-19 ENCOUNTER — Telehealth: Payer: Self-pay

## 2016-07-19 NOTE — Telephone Encounter (Signed)
Spoke with pt in regards to her cycle. Instructed on when to take clomid and when to have IC. Clomid erx. Pt states that this is round 4 of clomid. If no pregnancy after 6 months to make an appt with mad for referral.

## 2016-09-12 ENCOUNTER — Encounter: Payer: Self-pay | Admitting: Obstetrics and Gynecology

## 2016-09-13 ENCOUNTER — Other Ambulatory Visit: Payer: Self-pay

## 2016-09-13 MED ORDER — CLOMIPHENE CITRATE 50 MG PO TABS: 50.0000 mg | ORAL_TABLET | Freq: Every day | ORAL | 0 refills | Status: DC

## 2016-10-13 ENCOUNTER — Encounter: Payer: Self-pay | Admitting: Obstetrics and Gynecology

## 2016-10-19 ENCOUNTER — Encounter: Payer: Self-pay | Admitting: Obstetrics and Gynecology

## 2016-10-19 ENCOUNTER — Ambulatory Visit (INDEPENDENT_AMBULATORY_CARE_PROVIDER_SITE_OTHER): Payer: BLUE CROSS/BLUE SHIELD | Admitting: Obstetrics and Gynecology

## 2016-10-19 VITALS — BP 124/83 | HR 90 | Ht 63.0 in | Wt 157.1 lb

## 2016-10-19 DIAGNOSIS — N809 Endometriosis, unspecified: Secondary | ICD-10-CM | POA: Diagnosis not present

## 2016-10-19 DIAGNOSIS — N970 Female infertility associated with anovulation: Secondary | ICD-10-CM | POA: Diagnosis not present

## 2016-10-19 DIAGNOSIS — E282 Polycystic ovarian syndrome: Secondary | ICD-10-CM | POA: Diagnosis not present

## 2016-10-19 NOTE — Patient Instructions (Signed)
1. Referral to [facility]-Dr. Silverio Lay synechia 2. Continue with Clomid 50 mg a day days 5 through 9 with next cycle. 3. Continue with prenatal vitamins

## 2016-10-19 NOTE — Addendum Note (Signed)
Addended by: Sharon Seller on: 10/19/2016 04:22 PM   Modules accepted: Orders

## 2016-10-19 NOTE — Progress Notes (Signed)
Chief complaint: 1. 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  Review of menstrual cycle monitoring is notable for Clomid cycle intervals ranging from 30-32 days. Patient did have verification of ovulation with prior day 22 serum progesterone testing with first Clomid cycle.  Patient has known history of endometriosis. History of endometriosis diagnosed at age 26 via laparoscopy; Dr KinciusPerformed Ablation of Endometriosis Status post 6 months of Depo-Lupron therapy Status post years of cyclic birth control pill therapy Status post recent continuous OCP therapy for several years.  Oligomenorrhea since discontinuing OCPs in December 2016. 2 cycles since discontinuing OCPs. She is now status post 6 cycles of Clomid therapy.  OBJECTIVE: BP 124/83   Pulse 90   Ht  (1.6 m)   Wt 157 lb 1.6 oz (71.3 kg)   LMP 10/13/2016 (Exact Date)   BMI 27.83 kg/m  Physical exam-deferred  ASSESSMENT: 1. Endometriosis 2. Oligo ovulation; status post 6 cycles of Clomid ovulation induction without success 3. Age 36 4. Desires more aggressive workup and management of primary infertility  PLAN: 1. Referral to reproductive endocrinology, [facility], Dr April Manson  A total of 15 minutes were spent face-to-face with the patient during this encounter and over half of that time dealt with counseling and coordination of care.  Herold Harms, MD  Note: This dictation was prepared with Dragon dictation along with smaller phrase technology. Any transcriptional errors that result from this process are unintentional.

## 2016-11-14 ENCOUNTER — Encounter: Payer: Self-pay | Admitting: Obstetrics and Gynecology

## 2016-11-16 ENCOUNTER — Other Ambulatory Visit: Payer: Self-pay

## 2016-11-16 MED ORDER — CLOMIPHENE CITRATE 50 MG PO TABS: 50.0000 mg | ORAL_TABLET | Freq: Every day | ORAL | 0 refills | Status: DC

## 2016-11-16 MED ORDER — MEDROXYPROGESTERONE ACETATE 10 MG PO TABS: 10.0000 mg | ORAL_TABLET | Freq: Every day | ORAL | 0 refills | Status: DC

## 2016-11-22 ENCOUNTER — Encounter: Payer: Self-pay | Admitting: Obstetrics and Gynecology

## 2017-08-11 ENCOUNTER — Encounter: Payer: Self-pay | Admitting: Obstetrics and Gynecology

## 2017-08-19 ENCOUNTER — Encounter: Payer: Self-pay | Admitting: Obstetrics and Gynecology

## 2017-09-12 ENCOUNTER — Ambulatory Visit (INDEPENDENT_AMBULATORY_CARE_PROVIDER_SITE_OTHER): Payer: BLUE CROSS/BLUE SHIELD | Admitting: Obstetrics and Gynecology

## 2017-09-12 VITALS — BP 133/85 | HR 105 | Ht 63.0 in | Wt 153.8 lb

## 2017-09-12 DIAGNOSIS — Z3491 Encounter for supervision of normal pregnancy, unspecified, first trimester: Secondary | ICD-10-CM

## 2017-09-12 DIAGNOSIS — Z3A10 10 weeks gestation of pregnancy: Secondary | ICD-10-CM

## 2017-09-12 NOTE — Progress Notes (Signed)
Maria Drake Maria Drake presents for NOB nurse interview visit. Pregnancy confirmation done [facility].  G- 1.  P-    . Pregnancy education material explained and given. _There are cats in the home. NOB labs ordered due to  HIV labs and Drug screen were explained optional and she did not decline. Drug screen ordered. PNV encouraged. Genetic screening options discussed. Genetic testing:will discuss.  Pt may discuss with provider. Pt. Has a appointment with Dr. Algis Drake On March 19,2019.  I have reviewed the record and concur with patient management and plan. Maria Drake, Maria DeutscherMARTIN, MD, Maria CoreFACOG

## 2017-09-12 NOTE — Patient Instructions (Signed)
First Trimester of Pregnancy The first trimester of pregnancy is from week 1 until the end of week 13 (months 1 through 3). During this time, your baby will begin to develop inside you. At 6-8 weeks, the eyes and face are formed, and the heartbeat can be seen on ultrasound. At the end of 12 weeks, all the baby's organs are formed. Prenatal care is all the medical care you receive before the birth of your baby. Make sure you get good prenatal care and follow all of your doctor's instructions. Follow these instructions at home: Medicines  Take over-the-counter and prescription medicines only as told by your doctor. Some medicines are safe and some medicines are not safe during pregnancy.  Take a prenatal vitamin that contains at least 600 micrograms (mcg) of folic acid.  If you have trouble pooping (constipation), take medicine that will make your stool soft (stool softener) if your doctor approves. Eating and drinking  Eat regular, healthy meals.  Your doctor will tell you the amount of weight gain that is right for you.  Avoid raw meat and uncooked cheese.  If you feel sick to your stomach (nauseous) or throw up (vomit): ? Eat 4 or 5 small meals a day instead of 3 large meals. ? Try eating a few soda crackers. ? Drink liquids between meals instead of during meals.  To prevent constipation: ? Eat foods that are high in fiber, like fresh fruits and vegetables, whole grains, and beans. ? Drink enough fluids to keep your pee (urine) clear or pale yellow. Activity  Exercise only as told by your doctor. Stop exercising if you have cramps or pain in your lower belly (abdomen) or low back.  Do not exercise if it is too hot, too humid, or if you are in a place of great height (high altitude).  Try to avoid standing for long periods of time. Move your legs often if you must stand in one place for a long time.  Avoid heavy lifting.  Wear low-heeled shoes. Sit and stand up straight.  You  can have sex unless your doctor tells you not to. Relieving pain and discomfort  Wear a good support bra if your breasts are sore.  Take warm water baths (sitz baths) to soothe pain or discomfort caused by hemorrhoids. Use hemorrhoid cream if your doctor says it is okay.  Rest with your legs raised if you have leg cramps or low back pain.  If you have puffy, bulging veins (varicose veins) in your legs: ? Wear support hose or compression stockings as told by your doctor. ? Raise (elevate) your feet for 15 minutes, 3-4 times a day. ? Limit salt in your food. Prenatal care  Schedule your prenatal visits by the twelfth week of pregnancy.  Write down your questions. Take them to your prenatal visits.  Keep all your prenatal visits as told by your doctor. This is important. Safety  Wear your seat belt at all times when driving.  Make a list of emergency phone numbers. The list should include numbers for family, friends, the hospital, and police and fire departments. General instructions  Ask your doctor for a referral to a local prenatal class. Begin classes no later than at the start of month 6 of your pregnancy.  Ask for help if you need counseling or if you need help with nutrition. Your doctor can give you advice or tell you where to go for help.  Do not use hot tubs, steam rooms, or   saunas.  Do not douche or use tampons or scented sanitary pads.  Do not cross your legs for long periods of time.  Avoid all herbs and alcohol. Avoid drugs that are not approved by your doctor.  Do not use any tobacco products, including cigarettes, chewing tobacco, and electronic cigarettes. If you need help quitting, ask your doctor. You may get counseling or other support to help you quit.  Avoid cat litter boxes and soil used by cats. These carry germs that can cause birth defects in the baby and can cause a loss of your baby (miscarriage) or stillbirth.  Visit your dentist. At home, brush  your teeth with a soft toothbrush. Be gentle when you floss. Contact a doctor if:  You are dizzy.  You have mild cramps or pressure in your lower belly.  You have a nagging pain in your belly area.  You continue to feel sick to your stomach, you throw up, or you have watery poop (diarrhea).  You have a bad smelling fluid coming from your vagina.  You have pain when you pee (urinate).  You have increased puffiness (swelling) in your face, hands, legs, or ankles. Get help right away if:  You have a fever.  You are leaking fluid from your vagina.  You have spotting or bleeding from your vagina.  You have very bad belly cramping or pain.  You gain or lose weight rapidly.  You throw up blood. It may look like coffee grounds.  You are around people who have German measles, fifth disease, or chickenpox.  You have a very bad headache.  You have shortness of breath.  You have any kind of trauma, such as from a fall or a car accident. Summary  The first trimester of pregnancy is from week 1 until the end of week 13 (months 1 through 3).  To take care of yourself and your unborn baby, you will need to eat healthy meals, take medicines only if your doctor tells you to do so, and do activities that are safe for you and your baby.  Keep all follow-up visits as told by your doctor. This is important as your doctor will have to ensure that your baby is healthy and growing well. This information is not intended to replace advice given to you by your health care provider. Make sure you discuss any questions you have with your health care provider. Document Released: 12/15/2007 Document Revised: 07/06/2016 Document Reviewed: 07/06/2016 Elsevier Interactive Patient Education  2017 Elsevier Inc.  

## 2017-09-13 LAB — URINALYSIS, ROUTINE W REFLEX MICROSCOPIC
Bilirubin, UA: NEGATIVE
GLUCOSE, UA: NEGATIVE
NITRITE UA: NEGATIVE
PROTEIN UA: NEGATIVE
RBC, UA: NEGATIVE
Specific Gravity, UA: 1.015 (ref 1.005–1.030)
UUROB: 1 mg/dL (ref 0.2–1.0)
pH, UA: 5.5 (ref 5.0–7.5)

## 2017-09-13 LAB — MICROSCOPIC EXAMINATION: Casts: NONE SEEN /lpf

## 2017-09-13 LAB — MONITOR DRUG PROFILE 14(MW)
AMPHETAMINE SCREEN URINE: NEGATIVE ng/mL
BARBITURATE SCREEN URINE: NEGATIVE ng/mL
BENZODIAZEPINE SCREEN, URINE: NEGATIVE ng/mL
Buprenorphine, Urine: NEGATIVE ng/mL
CANNABINOIDS UR QL SCN: NEGATIVE ng/mL
Cocaine (Metab) Scrn, Ur: NEGATIVE ng/mL
Creatinine(Crt), U: 112.1 mg/dL (ref 20.0–300.0)
FENTANYL, URINE: NEGATIVE pg/mL
Meperidine Screen, Urine: NEGATIVE ng/mL
Methadone Screen, Urine: NEGATIVE ng/mL
OPIATE SCREEN URINE: NEGATIVE ng/mL
OXYCODONE+OXYMORPHONE UR QL SCN: NEGATIVE ng/mL
Ph of Urine: 5.3 (ref 4.5–8.9)
Phencyclidine Qn, Ur: NEGATIVE ng/mL
Propoxyphene Scrn, Ur: NEGATIVE ng/mL
SPECIFIC GRAVITY: 1.011
TRAMADOL SCREEN, URINE: NEGATIVE ng/mL

## 2017-09-13 LAB — CBC WITH DIFFERENTIAL/PLATELET
BASOS: 0 %
Basophils Absolute: 0 10*3/uL (ref 0.0–0.2)
EOS (ABSOLUTE): 0.1 10*3/uL (ref 0.0–0.4)
EOS: 1 %
HEMATOCRIT: 39.3 % (ref 34.0–46.6)
Hemoglobin: 13.4 g/dL (ref 11.1–15.9)
IMMATURE GRANS (ABS): 0 10*3/uL (ref 0.0–0.1)
IMMATURE GRANULOCYTES: 0 %
Lymphocytes Absolute: 3.1 10*3/uL (ref 0.7–3.1)
Lymphs: 33 %
MCH: 29.8 pg (ref 26.6–33.0)
MCHC: 34.1 g/dL (ref 31.5–35.7)
MCV: 88 fL (ref 79–97)
Monocytes Absolute: 0.5 10*3/uL (ref 0.1–0.9)
Monocytes: 5 %
NEUTROS PCT: 61 %
Neutrophils Absolute: 5.9 10*3/uL (ref 1.4–7.0)
Platelets: 283 10*3/uL (ref 150–379)
RBC: 4.49 x10E6/uL (ref 3.77–5.28)
RDW: 14 % (ref 12.3–15.4)
WBC: 9.6 10*3/uL (ref 3.4–10.8)

## 2017-09-13 LAB — RPR: RPR: NONREACTIVE

## 2017-09-13 LAB — URINE CULTURE: Organism ID, Bacteria: NO GROWTH

## 2017-09-13 LAB — VARICELLA ZOSTER ANTIBODY, IGG: Varicella zoster IgG: 982 index (ref >165)

## 2017-09-13 LAB — ABO AND RH: Rh Factor: POSITIVE

## 2017-09-13 LAB — HIV ANTIBODY (ROUTINE TESTING W REFLEX): HIV Screen 4th Generation wRfx: NONREACTIVE

## 2017-09-13 LAB — HEPATITIS B SURFACE ANTIGEN: Hepatitis B Surface Ag: NEGATIVE

## 2017-09-13 LAB — ANTIBODY SCREEN: Antibody Screen: NEGATIVE

## 2017-09-13 LAB — RUBELLA SCREEN: RUBELLA: 1.77 {index} (ref >0.99)

## 2017-09-14 ENCOUNTER — Encounter: Payer: Self-pay | Admitting: Obstetrics and Gynecology

## 2017-09-14 LAB — GC/CHLAMYDIA PROBE AMP
Chlamydia trachomatis, NAA: NEGATIVE
NEISSERIA GONORRHOEAE BY PCR: NEGATIVE

## 2017-09-25 ENCOUNTER — Encounter: Payer: Self-pay | Admitting: Obstetrics and Gynecology

## 2017-09-27 ENCOUNTER — Encounter: Payer: Self-pay | Admitting: Obstetrics and Gynecology

## 2017-09-27 ENCOUNTER — Ambulatory Visit (INDEPENDENT_AMBULATORY_CARE_PROVIDER_SITE_OTHER): Payer: BLUE CROSS/BLUE SHIELD | Admitting: Obstetrics and Gynecology

## 2017-09-27 VITALS — BP 117/79 | HR 109 | Wt 153.6 lb

## 2017-09-27 DIAGNOSIS — T7589XA Other specified effects of external causes, initial encounter: Secondary | ICD-10-CM

## 2017-09-27 DIAGNOSIS — IMO0002 Reserved for concepts with insufficient information to code with codable children: Secondary | ICD-10-CM

## 2017-09-27 DIAGNOSIS — E282 Polycystic ovarian syndrome: Secondary | ICD-10-CM

## 2017-09-27 DIAGNOSIS — N803 Endometriosis of pelvic peritoneum: Secondary | ICD-10-CM

## 2017-09-27 DIAGNOSIS — Z3401 Encounter for supervision of normal first pregnancy, first trimester: Secondary | ICD-10-CM

## 2017-09-27 DIAGNOSIS — E039 Hypothyroidism, unspecified: Secondary | ICD-10-CM

## 2017-09-27 LAB — POCT URINALYSIS DIPSTICK
BILIRUBIN UA: NEGATIVE
Blood, UA: NEGATIVE
Glucose, UA: NEGATIVE
KETONES UA: NEGATIVE
Leukocytes, UA: NEGATIVE
NITRITE UA: NEGATIVE
ODOR: NEGATIVE
PROTEIN UA: NEGATIVE
Urobilinogen, UA: 0.2 E.U./dL
pH, UA: 6.5 (ref 5.0–8.0)

## 2017-09-27 NOTE — Progress Notes (Signed)
NOB pe- Last pap 01/2014 neg/neg. Needs tsh checked. Cats in home.   NEW OB HISTORY AND PHYSICAL  SUBJECTIVE:       Maria Drake is a 37 y.o. G1P0 female, Patient's last menstrual period was 07/08/2017., Estimated Date of Delivery: 04/14/18, [id number], presents today for establishment of Prenatal Care.  History of primary infertility History of endometriosis History of PCO History of 6 months of Clomid ovulation induction without success Recently diagnosed with hypothyroidism  Referred to [facility]-Dr. Fermin Schwabamer Yalcinkaya; primary infertility treated; patient placed on metformin and Synthroid; first IUI unsuccessful; second IUI successful; first trimester scan has established EDD-04/14/2018  Recent nausea without vomiting.  Prenatal risk factors:  Primary infertility  History of endometriosis  History of PCO  Hypothyroidism  Reactive airway disease   History of paroxysmal atrial tachycardia  Obstetric History OB History  Gravida Para Term Preterm AB Living  1            SAB TAB Ectopic Multiple Live Births               # Outcome Date GA Lbr Len/2nd Weight Sex Delivery Anes PTL Lv  1 Current               Past Medical History:  Diagnosis Date  . Endometrial disorder   . Endometriosis   . Environmental allergies   . Headache   . Hyperthyroidism   . PCOS (polycystic ovarian syndrome)   . Tachycardia     Past Surgical History:  Procedure Laterality Date  . LAPAROSCOPIC ENDOMETRIOSIS FULGURATION    . WISDOM TOOTH EXTRACTION      Current Outpatient Medications on File Prior to Visit  Medication Sig Dispense Refill  . fluticasone (FLONASE) 50 MCG/ACT nasal spray Place into the nose.    . levothyroxine (SYNTHROID, LEVOTHROID) 25 MCG tablet Take 25 mcg by mouth daily before breakfast.    . Prenatal Vit-Fe Fumarate-FA (PRENATAL MULTIVITAMIN) TABS tablet Take 1 tablet by mouth daily at 12 noon.     No current facility-administered medications on  file prior to visit.     Allergies  Allergen Reactions  . Decongest-Aid [Pseudoephedrine] Anaphylaxis    tachycardia  . Codeine Nausea And Vomiting    Social History   Socioeconomic History  . Marital status: Married    Spouse name: Not on file  . Number of children: Not on file  . Years of education: Not on file  . Highest education level: Not on file  Social Needs  . Financial resource strain: Not on file  . Food insecurity - worry: Not on file  . Food insecurity - inability: Not on file  . Transportation needs - medical: Not on file  . Transportation needs - non-medical: Not on file  Occupational History  . Not on file  Tobacco Use  . Smoking status: Never Smoker  . Smokeless tobacco: Never Used  Substance and Sexual Activity  . Alcohol use: No  . Drug use: No  . Sexual activity: Yes    Birth control/protection: None  Other Topics Concern  . Not on file  Social History Narrative  . Not on file    Family History  Problem Relation Age of Onset  . Diabetes Mother   . Heart disease Father   . Diabetes Paternal Uncle   . Diabetes Maternal Grandfather   . Diabetes Paternal Grandfather   . Heart disease Paternal Grandfather   . Breast cancer Neg Hx   . Ovarian cancer  Neg Hx     The following portions of the patient's history were reviewed and updated as appropriate: allergies, current medications, past OB history, past medical history, past surgical history, past family history, past social history, and problem list.    OBJECTIVE: Initial Physical Exam (New OB)  GENERAL APPEARANCE: alert, well appearing, in no apparent distress, oriented to person, place and time, well hydrated HEAD: normocephalic, atraumatic MOUTH: mucous membranes moist, pharynx normal without lesions THYROID: no thyromegaly or masses present BREASTS: no masses noted, no significant tenderness, no palpable axillary nodes, no skin changes LUNGS: clear to auscultation, no wheezes, rales or  rhonchi, symmetric air entry HEART: no murmurs, regular rhythm; mild tachycardia ABDOMEN: soft, nontender, nondistended, no abnormal masses, no epigastric pain and fundus not palpable EXTREMITIES: no redness or tenderness in the calves or thighs, no edema SKIN: normal coloration and turgor, no rashes LYMPH NODES: no adenopathy palpable NEUROLOGIC: alert, oriented, normal speech, no focal findings or movement disorder noted  PELVIC EXAM EXTERNAL GENITALIA: Normal BUS-normal Vagina-normal Cervix-friable with Pap smear; no lesions Uterus-12 weeks size, mobile, nontender; fetal heart tones 173 Adnexa-nonpalpable nontender Rectovaginal-normal external exam  ASSESSMENT: Normal pregnancy-11.[redacted] weeks gestation Primary infertility, resolved with IUI Hypothyroidism, corrected on Synthroid History of endometriosis History of PCO History of allergic rhinitis  PLAN: Prenatal care Pap smear Prenatal labs Prenatal vitamins daily Synthroid 25 mcg daily Flonase 50 mcg/ACT nasal spray daily New OB counseling: The patient has been given an overview regarding routine prenatal care. Recommendations regarding diet, weight gain, and exercise in pregnancy were given. Prenatal testing, optional genetic testing, and ultrasound use in pregnancy were reviewed.  Benefits of Breast Feeding were discussed. The patient is encouraged to consider nursing her baby post partum. Return in 4 weeks for regular OB appointment-MD Obtain infertility records from [facility]

## 2017-09-27 NOTE — Patient Instructions (Signed)
1.  New OB exam is completed today 2.  Panorama testing is ordered 3.  Patient is to obtain infertility records from [facility] 4.  Toxo titer is ordered because of cat exposure 5.  Return in 4 weeks for regular OB appointment

## 2017-09-28 ENCOUNTER — Encounter: Payer: Self-pay | Admitting: Obstetrics and Gynecology

## 2017-09-28 ENCOUNTER — Other Ambulatory Visit: Payer: BLUE CROSS/BLUE SHIELD

## 2017-09-28 LAB — TOXOPLASMA ANTIBODIES- IGG AND  IGM

## 2017-09-28 LAB — THYROID PANEL WITH TSH
Free Thyroxine Index: 2.5 (ref 1.2–4.9)
T3 Uptake Ratio: 19 % — ABNORMAL LOW (ref 24–39)
T4, Total: 12.9 ug/dL — ABNORMAL HIGH (ref 4.5–12.0)
TSH: 0.317 u[IU]/mL — ABNORMAL LOW (ref 0.450–4.500)

## 2017-09-30 ENCOUNTER — Encounter: Payer: Self-pay | Admitting: Obstetrics and Gynecology

## 2017-10-01 LAB — IGP, COBASHPV16/18
HPV 16: NEGATIVE
HPV 18: NEGATIVE
HPV OTHER HR TYPES: NEGATIVE
PAP SMEAR COMMENT: 0

## 2017-10-11 ENCOUNTER — Encounter: Payer: Self-pay | Admitting: Obstetrics and Gynecology

## 2017-10-26 ENCOUNTER — Ambulatory Visit (INDEPENDENT_AMBULATORY_CARE_PROVIDER_SITE_OTHER): Payer: BLUE CROSS/BLUE SHIELD | Admitting: Obstetrics and Gynecology

## 2017-10-26 ENCOUNTER — Encounter: Payer: BLUE CROSS/BLUE SHIELD | Admitting: Obstetrics and Gynecology

## 2017-10-26 VITALS — BP 121/80 | HR 102 | Wt 157.6 lb

## 2017-10-26 DIAGNOSIS — O09812 Supervision of pregnancy resulting from assisted reproductive technology, second trimester: Secondary | ICD-10-CM

## 2017-10-26 DIAGNOSIS — Z3A10 10 weeks gestation of pregnancy: Secondary | ICD-10-CM

## 2017-10-26 DIAGNOSIS — Z3482 Encounter for supervision of other normal pregnancy, second trimester: Secondary | ICD-10-CM

## 2017-10-26 LAB — POCT URINALYSIS DIPSTICK
BILIRUBIN UA: NEGATIVE
Blood, UA: NEGATIVE
Glucose, UA: NEGATIVE
Ketones, UA: NEGATIVE
LEUKOCYTES UA: NEGATIVE
Nitrite, UA: NEGATIVE
PH UA: 5 (ref 5.0–8.0)
PROTEIN UA: NEGATIVE
Spec Grav, UA: 1.01 (ref 1.010–1.025)
Urobilinogen, UA: 0.2 E.U./dL

## 2017-10-26 MED ORDER — CITRANATAL ASSURE 35-1 & 300 MG PO MISC: 1.0000 | Freq: Every day | ORAL | 3 refills | Status: DC

## 2017-10-26 MED ORDER — LEVOTHYROXINE SODIUM 25 MCG PO TABS: 25.0000 ug | ORAL_TABLET | Freq: Every day | ORAL | 1 refills | Status: DC

## 2017-10-26 NOTE — Progress Notes (Signed)
ROB-pt is having some pain in the anal area like endometriosis pain.

## 2017-10-26 NOTE — Addendum Note (Signed)
Addended by: Silvano BilisHAMPTON, Eveline Sauve L on: 10/26/2017 04:48 PM   Modules accepted: Orders

## 2017-10-26 NOTE — Progress Notes (Signed)
ROB: Patient has occasional rectal pain similar to previous endometriosis.  Not disabling.  We discussed this in some detail and we discussed the effect of pregnancy on endometriosis.  Patient desires AFP today.  FAS with next visit at 19 weeks.  Labor discussed, epidural discussed.

## 2017-10-27 ENCOUNTER — Encounter: Payer: Self-pay | Admitting: Obstetrics and Gynecology

## 2017-10-29 LAB — AFP, SERUM, OPEN SPINA BIFIDA
AFP MOM: 0.66
AFP VALUE AFPOSL: 16.6 ng/mL
Gest. Age on Collection Date: 15 weeks
MATERNAL AGE AT EDD: 35.2 a
OSBR Risk 1 IN: 10000
TEST RESULTS AFP: NEGATIVE
WEIGHT: 157 [lb_av]

## 2017-11-09 ENCOUNTER — Other Ambulatory Visit: Payer: Self-pay | Admitting: Obstetrics and Gynecology

## 2017-11-09 DIAGNOSIS — Z3689 Encounter for other specified antenatal screening: Secondary | ICD-10-CM

## 2017-11-23 ENCOUNTER — Ambulatory Visit (INDEPENDENT_AMBULATORY_CARE_PROVIDER_SITE_OTHER): Payer: BLUE CROSS/BLUE SHIELD

## 2017-11-23 ENCOUNTER — Ambulatory Visit (INDEPENDENT_AMBULATORY_CARE_PROVIDER_SITE_OTHER): Payer: BLUE CROSS/BLUE SHIELD | Admitting: Obstetrics and Gynecology

## 2017-11-23 VITALS — BP 120/77 | HR 101 | Wt 163.8 lb

## 2017-11-23 DIAGNOSIS — Z3689 Encounter for other specified antenatal screening: Secondary | ICD-10-CM | POA: Diagnosis not present

## 2017-11-23 DIAGNOSIS — R0989 Other specified symptoms and signs involving the circulatory and respiratory systems: Secondary | ICD-10-CM

## 2017-11-23 DIAGNOSIS — M549 Dorsalgia, unspecified: Secondary | ICD-10-CM | POA: Diagnosis not present

## 2017-11-23 DIAGNOSIS — O0992 Supervision of high risk pregnancy, unspecified, second trimester: Secondary | ICD-10-CM | POA: Diagnosis not present

## 2017-11-23 DIAGNOSIS — O350XX0 Maternal care for (suspected) central nervous system malformation in fetus, not applicable or unspecified: Secondary | ICD-10-CM

## 2017-11-23 DIAGNOSIS — O9989 Other specified diseases and conditions complicating pregnancy, childbirth and the puerperium: Secondary | ICD-10-CM | POA: Diagnosis not present

## 2017-11-23 DIAGNOSIS — IMO0002 Reserved for concepts with insufficient information to code with codable children: Secondary | ICD-10-CM

## 2017-11-23 NOTE — Patient Instructions (Signed)
Second Trimester of Pregnancy The second trimester is from week 13 through week 28, month 4 through 6. This is often the time in pregnancy that you feel your best. Often times, morning sickness has lessened or quit. You may have more energy, and you may get hungry more often. Your unborn baby (fetus) is growing rapidly. At the end of the sixth month, he or she is about 9 inches long and weighs about 1 pounds. You will likely feel the baby move (quickening) between 18 and 20 weeks of pregnancy. Follow these instructions at home:  Avoid all smoking, herbs, and alcohol. Avoid drugs not approved by your doctor.  Do not use any tobacco products, including cigarettes, chewing tobacco, and electronic cigarettes. If you need help quitting, ask your doctor. You may get counseling or other support to help you quit.  Only take medicine as told by your doctor. Some medicines are safe and some are not during pregnancy.  Exercise only as told by your doctor. Stop exercising if you start having cramps.  Eat regular, healthy meals.  Wear a good support bra if your breasts are tender.  Do not use hot tubs, steam rooms, or saunas.  Wear your seat belt when driving.  Avoid raw meat, uncooked cheese, and liter boxes and soil used by cats.  Take your prenatal vitamins.  Take 1500-2000 milligrams of calcium daily starting at the 20th week of pregnancy until you deliver your baby.  Try taking medicine that helps you poop (stool softener) as needed, and if your doctor approves. Eat more fiber by eating fresh fruit, vegetables, and whole grains. Drink enough fluids to keep your pee (urine) clear or pale yellow.  Take warm water baths (sitz baths) to soothe pain or discomfort caused by hemorrhoids. Use hemorrhoid cream if your doctor approves.  If you have puffy, bulging veins (varicose veins), wear support hose. Raise (elevate) your feet for 15 minutes, 3-4 times a day. Limit salt in your diet.  Avoid heavy  lifting, wear low heals, and sit up straight.  Rest with your legs raised if you have leg cramps or low back pain.  Visit your dentist if you have not gone during your pregnancy. Use a soft toothbrush to brush your teeth. Be gentle when you floss.  You can have sex (intercourse) unless your doctor tells you not to.  Go to your doctor visits. Get help if:  You feel dizzy.  You have mild cramps or pressure in your lower belly (abdomen).  You have a nagging pain in your belly area.  You continue to feel sick to your stomach (nauseous), throw up (vomit), or have watery poop (diarrhea).  You have bad smelling fluid coming from your vagina.  You have pain with peeing (urination). Get help right away if:  You have a fever.  You are leaking fluid from your vagina.  You have spotting or bleeding from your vagina.  You have severe belly cramping or pain.  You lose or gain weight rapidly.  You have trouble catching your breath and have chest pain.  You notice sudden or extreme puffiness (swelling) of your face, hands, ankles, feet, or legs.  You have not felt the baby move in over an hour.  You have severe headaches that do not go away with medicine.  You have vision changes. This information is not intended to replace advice given to you by your health care provider. Make sure you discuss any questions you have with your health care   provider. Document Released: 09/22/2009 Document Revised: 12/04/2015 Document Reviewed: 08/29/2012 Elsevier Interactive Patient Education  2017 Elsevier Inc.  

## 2017-11-23 NOTE — Progress Notes (Signed)
ROB-pt stated that she has been having some headaches off and on. Pt stated that she is feeling pressure in the back of her head and front of forehead x 5 days. Pt stated that she had a sore throat as well during that time.

## 2017-11-24 DIAGNOSIS — O3506X0 Maternal care for (suspected) central nervous system malformation or damage in fetus, hydrocephaly, not applicable or unspecified: Secondary | ICD-10-CM | POA: Insufficient documentation

## 2017-11-24 DIAGNOSIS — O0992 Supervision of high risk pregnancy, unspecified, second trimester: Secondary | ICD-10-CM | POA: Insufficient documentation

## 2017-11-24 DIAGNOSIS — IMO0002 Reserved for concepts with insufficient information to code with codable children: Secondary | ICD-10-CM | POA: Insufficient documentation

## 2017-11-24 DIAGNOSIS — O350XX0 Maternal care for (suspected) central nervous system malformation in fetus, not applicable or unspecified: Secondary | ICD-10-CM | POA: Insufficient documentation

## 2017-11-24 NOTE — Progress Notes (Signed)
ROB: Patient reports off and on headaches, pressure in back and front of head, and mild sore throat with non-productive cough. Likely sinus/allergy symptoms. Has recently started antihistamine, also discussed nasal saline spray/nettie pot.  Cannot use antihistamines due to h/o benign tachycardia.  Also reports back pain, discussed treatment measures. All general questions regarding pregnancy answered.  Lengthy discussion had on anatomy scan, normal growth and fluid, however isolated ventriculomegaly noted.  Will refer to [facility]FM for further follow up and recommendations. Will need repeat thyroid studies next visit for h/o hypothyroidism. Patient voided prior to visit. RTC in 4 weeks.  A total of 25 minutes were spent face-to-face with the patient during this encounter and over half of that time involved counseling and coordination of care.   Hildred Laser, MD [facility]

## 2017-12-13 ENCOUNTER — Encounter: Payer: Self-pay | Admitting: Obstetrics and Gynecology

## 2017-12-27 ENCOUNTER — Encounter: Payer: BLUE CROSS/BLUE SHIELD | Admitting: Obstetrics and Gynecology

## 2018-02-22 DIAGNOSIS — Z8679 Personal history of other diseases of the circulatory system: Secondary | ICD-10-CM | POA: Insufficient documentation

## 2018-03-27 DIAGNOSIS — G91 Hydrocephalus: Secondary | ICD-10-CM

## 2018-03-27 DIAGNOSIS — O139 Gestational [pregnancy-induced] hypertension without significant proteinuria, unspecified trimester: Secondary | ICD-10-CM

## 2018-03-27 DIAGNOSIS — O24419 Gestational diabetes mellitus in pregnancy, unspecified control: Secondary | ICD-10-CM

## 2018-05-08 ENCOUNTER — Other Ambulatory Visit: Payer: Self-pay | Admitting: Internal Medicine

## 2018-05-08 DIAGNOSIS — R1011 Right upper quadrant pain: Secondary | ICD-10-CM

## 2018-05-08 DIAGNOSIS — R399 Unspecified symptoms and signs involving the genitourinary system: Secondary | ICD-10-CM

## 2018-05-10 ENCOUNTER — Ambulatory Visit
Admission: RE | Admit: 2018-05-10 | Discharge: 2018-05-10 | Disposition: A | Discharge status: Home / Self Care | Payer: BLUE CROSS/BLUE SHIELD | Source: Ambulatory Visit | Attending: Internal Medicine | Admitting: Internal Medicine

## 2018-05-10 DIAGNOSIS — R399 Unspecified symptoms and signs involving the genitourinary system: Secondary | ICD-10-CM | POA: Insufficient documentation

## 2018-05-10 DIAGNOSIS — R1011 Right upper quadrant pain: Secondary | ICD-10-CM

## 2018-05-10 DIAGNOSIS — R160 Hepatomegaly, not elsewhere classified: Secondary | ICD-10-CM | POA: Diagnosis not present

## 2018-05-10 DIAGNOSIS — K8020 Calculus of gallbladder without cholecystitis without obstruction: Secondary | ICD-10-CM | POA: Insufficient documentation

## 2018-05-16 DIAGNOSIS — K8020 Calculus of gallbladder without cholecystitis without obstruction: Secondary | ICD-10-CM | POA: Diagnosis not present

## 2018-05-16 DIAGNOSIS — K8080 Other cholelithiasis without obstruction: Secondary | ICD-10-CM | POA: Diagnosis not present

## 2018-05-16 DIAGNOSIS — R1011 Right upper quadrant pain: Secondary | ICD-10-CM | POA: Diagnosis not present

## 2018-05-17 ENCOUNTER — Encounter: Payer: Self-pay | Admitting: Obstetrics and Gynecology

## 2018-05-17 ENCOUNTER — Ambulatory Visit (INDEPENDENT_AMBULATORY_CARE_PROVIDER_SITE_OTHER): Payer: BLUE CROSS/BLUE SHIELD | Admitting: Obstetrics and Gynecology

## 2018-05-17 VITALS — BP 108/70 | HR 70 | Ht 63.0 in | Wt 151.8 lb

## 2018-05-17 DIAGNOSIS — Z30011 Encounter for initial prescription of contraceptive pills: Secondary | ICD-10-CM

## 2018-05-17 DIAGNOSIS — N809 Endometriosis, unspecified: Secondary | ICD-10-CM | POA: Diagnosis not present

## 2018-05-17 DIAGNOSIS — E282 Polycystic ovarian syndrome: Secondary | ICD-10-CM

## 2018-05-17 DIAGNOSIS — E039 Hypothyroidism, unspecified: Secondary | ICD-10-CM

## 2018-05-17 DIAGNOSIS — Z8632 Personal history of gestational diabetes: Secondary | ICD-10-CM

## 2018-05-17 DIAGNOSIS — O9270 Unspecified disorders of lactation: Secondary | ICD-10-CM

## 2018-05-17 DIAGNOSIS — N970 Female infertility associated with anovulation: Secondary | ICD-10-CM | POA: Diagnosis not present

## 2018-05-17 MED ORDER — NORETHINDRONE 0.35 MG PO TABS: 1.0000 | ORAL_TABLET | Freq: Every day | ORAL | 11 refills | Status: DC

## 2018-05-17 NOTE — Patient Instructions (Addendum)
1.  Begin minipill daily 2.  Continue breast-feeding exclusively 3.  Massage breast after breast-feeding on that breast if fullness persists (consistent with blocked duct) 4.  Watch for signs of mastitis 5.  Return in 3 months to see Dr. Valentino Saxon with rechecking hemoglobin A1c and TSH at that time   Breastfeeding Choosing to breastfeed is one of the best decisions you can make for yourself and your baby. A change in hormones during pregnancy causes your breasts to make breast milk in your milk-producing glands. Hormones prevent breast milk from being released before your baby is born. They also prompt milk flow after birth. Once breastfeeding has begun, thoughts of your baby, as well as his or her sucking or crying, can stimulate the release of milk from your milk-producing glands. Benefits of breastfeeding Research shows that breastfeeding offers many health benefits for infants and mothers. It also offers a cost-free and convenient way to feed your baby. For your baby  Your first milk (colostrum) helps your baby's digestive system to function better.  Special cells in your milk (antibodies) help your baby to fight off infections.  Breastfed babies are less likely to develop asthma, allergies, obesity, or type 2 diabetes. They are also at lower risk for sudden infant death syndrome (SIDS).  Nutrients in breast milk are better able to meet your baby's needs compared to infant formula.  Breast milk improves your baby's brain development. For you  Breastfeeding helps to create a very special bond between you and your baby.  Breastfeeding is convenient. Breast milk costs nothing and is always available at the correct temperature.  Breastfeeding helps to burn calories. It helps you to lose the weight that you gained during pregnancy.  Breastfeeding makes your uterus return faster to its size before pregnancy. It also slows bleeding (lochia) after you give birth.  Breastfeeding helps to  lower your risk of developing type 2 diabetes, osteoporosis, rheumatoid arthritis, cardiovascular disease, and breast, ovarian, uterine, and endometrial cancer later in life. Breastfeeding basics Starting breastfeeding  Find a comfortable place to sit or lie down, with your neck and back well-supported.  Place a pillow or a rolled-up blanket under your baby to bring him or her to the level of your breast (if you are seated). Nursing pillows are specially designed to help support your arms and your baby while you breastfeed.  Make sure that your baby's tummy (abdomen) is facing your abdomen.  Gently massage your breast. With your fingertips, massage from the outer edges of your breast inward toward the nipple. This encourages milk flow. If your milk flows slowly, you may need to continue this action during the feeding.  Support your breast with 4 fingers underneath and your thumb above your nipple (make the letter "C" with your hand). Make sure your fingers are well away from your nipple and your baby's mouth.  Stroke your baby's lips gently with your finger or nipple.  When your baby's mouth is open wide enough, quickly bring your baby to your breast, placing your entire nipple and as much of the areola as possible into your baby's mouth. The areola is the colored area around your nipple. ? More areola should be visible above your baby's upper lip than below the lower lip. ? Your baby's lips should be opened and extended outward (flanged) to ensure an adequate, comfortable latch. ? Your baby's tongue should be between his or her lower gum and your breast.  Make sure that your baby's mouth is correctly  positioned around your nipple (latched). Your baby's lips should create a seal on your breast and be turned out (everted).  It is common for your baby to suck about 2-3 minutes in order to start the flow of breast milk. Latching Teaching your baby how to latch onto your breast properly is very  important. An improper latch can cause nipple pain, decreased milk supply, and poor weight gain in your baby. Also, if your baby is not latched onto your nipple properly, he or she may swallow some air during feeding. This can make your baby fussy. Burping your baby when you switch breasts during the feeding can help to get rid of the air. However, teaching your baby to latch on properly is still the best way to prevent fussiness from swallowing air while breastfeeding. Signs that your baby has successfully latched onto your nipple  Silent tugging or silent sucking, without causing you pain. Infant's lips should be extended outward (flanged).  Swallowing heard between every 3-4 sucks once your milk has started to flow (after your let-down milk reflex occurs).  Muscle movement above and in front of his or her ears while sucking.  Signs that your baby has not successfully latched onto your nipple  Sucking sounds or smacking sounds from your baby while breastfeeding.  Nipple pain.  If you think your baby has not latched on correctly, slip your finger into the corner of your baby's mouth to break the suction and place it between your baby's gums. Attempt to start breastfeeding again. Signs of successful breastfeeding Signs from your baby  Your baby will gradually decrease the number of sucks or will completely stop sucking.  Your baby will fall asleep.  Your baby's body will relax.  Your baby will retain a small amount of milk in his or her mouth.  Your baby will let go of your breast by himself or herself.  Signs from you  Breasts that have increased in firmness, weight, and size 1-3 hours after feeding.  Breasts that are softer immediately after breastfeeding.  Increased milk volume, as well as a change in milk consistency and color by the fifth day of breastfeeding.  Nipples that are not sore, cracked, or bleeding.  Signs that your baby is getting enough milk  Wetting at least  1-2 diapers during the first 24 hours after birth.  Wetting at least 5-6 diapers every 24 hours for the first week after birth. The urine should be clear or pale yellow by the age of 5 days.  Wetting 6-8 diapers every 24 hours as your baby continues to grow and develop.  At least 3 stools in a 24-hour period by the age of 5 days. The stool should be soft and yellow.  At least 3 stools in a 24-hour period by the age of 7 days. The stool should be seedy and yellow.  No loss of weight greater than 10% of birth weight during the first 3 days of life.  Average weight gain of 4-7 oz (113-198 g) per week after the age of 4 days.  Consistent daily weight gain by the age of 5 days, without weight loss after the age of 2 weeks. After a feeding, your baby may spit up a small amount of milk. This is normal. Breastfeeding frequency and duration Frequent feeding will help you make more milk and can prevent sore nipples and extremely full breasts (breast engorgement). Breastfeed when you feel the need to reduce the fullness of your breasts or when  your baby shows signs of hunger. This is called "breastfeeding on demand." Signs that your baby is hungry include:  Increased alertness, activity, or restlessness.  Movement of the head from side to side.  Opening of the mouth when the corner of the mouth or cheek is stroked (rooting).  Increased sucking sounds, smacking lips, cooing, sighing, or squeaking.  Hand-to-mouth movements and sucking on fingers or hands.  Fussing or crying.  Avoid introducing a pacifier to your baby in the first 4-6 weeks after your baby is born. After this time, you may choose to use a pacifier. Research has shown that pacifier use during the first year of a baby's life decreases the risk of sudden infant death syndrome (SIDS). Allow your baby to feed on each breast as long as he or she wants. When your baby unlatches or falls asleep while feeding from the first breast, offer  the second breast. Because newborns are often sleepy in the first few weeks of life, you may need to awaken your baby to get him or her to feed. Breastfeeding times will vary from baby to baby. However, the following rules can serve as a guide to help you make sure that your baby is properly fed:  Newborns (babies 48 weeks of age or younger) may breastfeed every 1-3 hours.  Newborns should not go without breastfeeding for longer than 3 hours during the day or 5 hours during the night.  You should breastfeed your baby a minimum of 8 times in a 24-hour period.  Breast milk pumping Pumping and storing breast milk allows you to make sure that your baby is exclusively fed your breast milk, even at times when you are unable to breastfeed. This is especially important if you go back to work while you are still breastfeeding, or if you are not able to be present during feedings. Your lactation consultant can help you find a method of pumping that works best for you and give you guidelines about how long it is safe to store breast milk. Caring for your breasts while you breastfeed Nipples can become dry, cracked, and sore while breastfeeding. The following recommendations can help keep your breasts moisturized and healthy:  Avoid using soap on your nipples.  Wear a supportive bra designed especially for nursing. Avoid wearing underwire-style bras or extremely tight bras (sports bras).  Air-dry your nipples for 3-4 minutes after each feeding.  Use only cotton bra pads to absorb leaked breast milk. Leaking of breast milk between feedings is normal.  Use lanolin on your nipples after breastfeeding. Lanolin helps to maintain your skin's normal moisture barrier. Pure lanolin is not harmful (not toxic) to your baby. You may also hand express a few drops of breast milk and gently massage that milk into your nipples and allow the milk to air-dry.  In the first few weeks after giving birth, some women  experience breast engorgement. Engorgement can make your breasts feel heavy, warm, and tender to the touch. Engorgement peaks within 3-5 days after you give birth. The following recommendations can help to ease engorgement:  Completely empty your breasts while breastfeeding or pumping. You may want to start by applying warm, moist heat (in the shower or with warm, water-soaked hand towels) just before feeding or pumping. This increases circulation and helps the milk flow. If your baby does not completely empty your breasts while breastfeeding, pump any extra milk after he or she is finished.  Apply ice packs to your breasts immediately after breastfeeding  or pumping, unless this is too uncomfortable for you. To do this: ? Put ice in a plastic bag. ? Place a towel between your skin and the bag. ? Leave the ice on for 20 minutes, 2-3 times a day.  Make sure that your baby is latched on and positioned properly while breastfeeding.  If engorgement persists after 48 hours of following these recommendations, contact your health care provider or a Advertising copywriter. Overall health care recommendations while breastfeeding  Eat 3 healthy meals and 3 snacks every day. Well-nourished mothers who are breastfeeding need an additional 450-500 calories a day. You can meet this requirement by increasing the amount of a balanced diet that you eat.  Drink enough water to keep your urine pale yellow or clear.  Rest often, relax, and continue to take your prenatal vitamins to prevent fatigue, stress, and low vitamin and mineral levels in your body (nutrient deficiencies).  Do not use any products that contain nicotine or tobacco, such as cigarettes and e-cigarettes. Your baby may be harmed by chemicals from cigarettes that pass into breast milk and exposure to secondhand smoke. If you need help quitting, ask your health care provider.  Avoid alcohol.  Do not use illegal drugs or marijuana.  Talk with your  health care provider before taking any medicines. These include over-the-counter and prescription medicines as well as vitamins and herbal supplements. Some medicines that may be harmful to your baby can pass through breast milk.  It is possible to become pregnant while breastfeeding. If birth control is desired, ask your health care provider about options that will be safe while breastfeeding your baby. Where to find more information: [employer]: www.llli.org Contact a health care provider if:  You feel like you want to stop breastfeeding or have become frustrated with breastfeeding.  Your nipples are cracked or bleeding.  Your breasts are red, tender, or warm.  You have: ? Painful breasts or nipples. ? A swollen area on either breast. ? A fever or chills. ? Nausea or vomiting. ? Drainage other than breast milk from your nipples.  Your breasts do not become full before feedings by the fifth day after you give birth.  You feel sad and depressed.  Your baby is: ? Too sleepy to eat well. ? Having trouble sleeping. ? More than 21 week old and wetting fewer than 6 diapers in a 24-hour period. ? Not gaining weight by 70 days of age.  Your baby has fewer than 3 stools in a 24-hour period.  Your baby's skin or the white parts of his or her eyes become yellow. Get help right away if:  Your baby is overly tired (lethargic) and does not want to wake up and feed.  Your baby develops an unexplained fever. Summary  Breastfeeding offers many health benefits for infant and mothers.  Try to breastfeed your infant when he or she shows early signs of hunger.  Gently tickle or stroke your baby's lips with your finger or nipple to allow the baby to open his or her mouth. Bring the baby to your breast. Make sure that much of the areola is in your baby's mouth. Offer one side and burp the baby before you offer the other side.  Talk with your health care provider or lactation  consultant if you have questions or you face problems as you breastfeed. This information is not intended to replace advice given to you by your health care provider. Make sure you discuss any  questions you have with your health care provider. Document Released: 06/28/2005 Document Revised: 07/30/2016 Document Reviewed: 07/30/2016 Elsevier Interactive Patient Education  [employer].

## 2018-05-17 NOTE — Progress Notes (Signed)
Chief complaint: 1.  Contraceptive management discussion 2.  History of gestational diabetes mellitus 3.  History of PCO 4.  History of hypothyroidism 5.  History of primary infertility 6.  Status post primary low cervical transverse cesarean section secondary to fetal hydrocephalus and ventriculomegaly    DOD: 03/27/2018 S/P Primary LTCS Dx: 37.3-week IUP, delivered; viable female; fetal hydrocephalus (no surgical intervention at this time, being followed by neurosurgery); gestational hypertension, resolved; gestational diabetes mellitus, post partum hemoglobin A1c consistent with the prediabetes range; hypothyroidism, normal TSH postpartum, off Synthroid; PCO, with history of amenorrhea or oligomenorrhea, requiring ovulation induction and intrauterine insemination for successful pregnancy; endometriosis, currently asymptomatic; breast-feeding exclusively at this time.  Major concerns today include what type of contraception to employ considering patient's past history, current breast-feeding status, and concerns about possible future pregnancy trial.  Patient is to undergo  MRI of the abdomen tomorrow for evaluation of an abnormal liver lesion that was seen on ultrasound, thought to be possibly consistent with a hemangioma.  The patient does not have any liver disease symptoms including pruritus, jaundice, right upper quadrant pain, etc. The patient does have gallbladder disease with multiple small stones and sludge along with a thickened gallbladder on ultrasound; she has seen and is following up with GI medicine. Maria Drake is exclusively breast-feeding at this time; she has had some issues off and on with a fullness in the left breast that does not decompress with nursing, thought to be consistent with a blocked duct.  She is not experiencing any symptoms of mastitis.  [name]'s son had hydrocephalus and required cesarean section delivery.  Genetic testing was negative; no obvious etiology of the  hydrocephalus and ventriculomegaly was identified.  She did have some concerns about possible TOLAC in future pregnancies.  She also had multiple questions about conception given her history of primary infertility, not responding to Clomid therapy over 6 cycles, while subsequently responding to metformin, Synthroid, letrozole ovulation induction, hCG (?)  Injections, and IUI through Dr. Fermin Schwab.  She is of advanced maternal age-greater than 35 years old.  OBJECTIVE: BP 108/70   Pulse 70   Ht 5\' 3"  (1.6 m)   Wt 151 lb 12.8 oz (68.9 kg)   LMP 07/08/2017   BMI 26.89 kg/m   Pleasant well-appearing white female, slightly nervous, in no acute distress.  Alert and oriented.  She is very engaging with questioning about her past history, current history, and future potential for pregnancy. Neck: Supple without thyromegaly or adenopathy Breasts: Lactation changes bilaterally are noted; no evidence of mastitis.  Left breast notable for some fullness in the upper outer quadrant almost consistent with blocked duct; massage of this area with two-handed technique did produce milk and slight reduction in fullness of the glandular tissue.  ASSESSMENT: 1.  History of primary infertility; status post 6 cycles of Clomid, unsuccessful; status post metformin/Synthroid/Femara ovulation induction/hCG injections/IUI with success 2.  Status post cesarean section delivery due to fetal hydrocephalus and ventriculomegaly, etiology unknown.  Genetic testing negative. 3.  Desires future pregnancy 4.  Advanced maternal age 37.  History of gestational diabetes mellitus, with postop hemoglobin A1c in the prediabetes range 6.  History of hypothyroidism, normal TSH postpartum, off Synthroid 7.  Desires reversible hormonal plan contraception as she wishes to have another child in the near future. 8.  Left breast with possible blocked duct, responds to 2 handed massage technique.  PLAN: 1.  Patient instructed on how to  decompress breast with two-handed massage; she is  encouraged to do this after completion of infant nursing on the left breast. 2.  Mastitis precautions given 3.  Begin Micronor minipill for contraception 4.  Recommend converting to low-dose OCP after patient stops breast-feeding or if she starts supplementing with other foods.  OCPs will help her PCO symptomatology. 5.  Repeat hemoglobin A1c in 3 months 6.  Repeat TSH in 3 months 7.  Due to advanced maternal age, when patient desires to proceed with attempt at conception, she is to discontinue oral contraceptives and watch for spontaneous onset of menses.  If this does not occur, Provera withdrawal is suggested, and this would be followed with letrozole ovulation induction to optimize her potential for conception as soon as possible given her age.  She also may need to go on metformin and Synthroid-and I would base this on her screening labs.  Another option would be to refer her back to Dr. April Manson ([facility]) to be aggressive with conception management. 8.  Patient is to return to see Dr. Valentino Saxon in 3 months.  A total of 25 minutes were spent face-to-face with the patient during this encounter and over half of that time involved counseling and coordination of care.  Herold Harms, MD  Note: This dictation was prepared with Dragon dictation along with smaller phrase technology. Any transcriptional errors that result from this process are unintentional.

## 2018-05-18 DIAGNOSIS — K8020 Calculus of gallbladder without cholecystitis without obstruction: Secondary | ICD-10-CM | POA: Diagnosis not present

## 2018-05-18 DIAGNOSIS — K769 Liver disease, unspecified: Secondary | ICD-10-CM | POA: Diagnosis not present

## 2018-05-22 ENCOUNTER — Other Ambulatory Visit: Payer: Self-pay | Admitting: Surgery

## 2018-05-22 DIAGNOSIS — R1011 Right upper quadrant pain: Secondary | ICD-10-CM | POA: Diagnosis not present

## 2018-05-26 ENCOUNTER — Ambulatory Visit: Payer: Self-pay | Admitting: Surgery

## 2018-05-26 NOTE — H&P (Signed)
Subjective:   CC: Right upper quadrant pain [R10.11]  HPI:  Maria Drake is a 37 y.o. female who was referred by Dewayne Hatch,* for evaluation of above CC. Symptoms were first noted a few months ago. Pain is sharp and intermittent, started in mid to right back side and RUQ, radiating to bilateral subcostal region and around right flank.  Associated with nausea/vomiting, exacerbated by nothing specific.  The initial episode happened while she was pregnant,  The second episode happened a few weeks ago and was excruciating to the point where she had to stop what she was doing until it resolved without any specific intervention.  [place] and showed sludge, mild wall thickening, with CMP showing slightly elevated AP.  Currently patient is asymptomatic, not having anymore issues.      Past Medical History:  has a past medical history of Acne, Diabetes mellitus type 2, uncomplicated (CMS-HCC), Endometriosis of pelvis, Focal nodular hyperplasia of liver, Headache, History of tachycardia, Hypothyroidism (acquired), unspecified, PCOS (polycystic ovarian syndrome), and Rhinitis, allergic.  Past Surgical History:  has a past surgical history that includes Pelvic laparoscopy; wisdom tooth extraction; Skin biopsy; and cesarean delivery (N/A, 03/27/2018).  Family History: family history includes Diabetes in her maternal grandfather, mother, paternal grandfather, and paternal uncle; Diabetes type II in an other family member; Heart disease in her father and paternal grandfather; Psoriasis in her father.  Social History:  reports that she has never smoked. She has never used smokeless tobacco. She reports that she does not drink alcohol or use drugs.  Current Medications: has a current medication list which includes the following prescription(s): azelaic acid, cetirizine, famotidine, prenatal vitamin-iron-fa-dha, and triamcinolone.  Allergies:       Allergies as of 05/22/2018 - Reviewed  05/22/2018  Allergen Reaction Noted  . Pseudoephedrine Palpitations 12/29/2014  . Chlorphen-phenyltolox-pe-ppa Other (See Comments) 11/14/2014  . Codeine Other (See Comments)   . Others Other (See Comments)     ROS:  A 15 point review of systems was performed and pertinent positives and negatives noted in HPI    Objective:   BP 112/75   Pulse 80   Temp 36.3 C (97.3 F) (Oral)   Ht 160 cm ('5\' 3"' )   Wt 69.1 kg (152 lb 5.4 oz)   BMI 26.99 kg/m    Constitutional :  alert, appears stated age, cooperative and no distress  Lymphatics/Throat:  no asymmetry, masses, or scars  Respiratory:  clear to auscultation bilaterally  Cardiovascular:  regular rate and rhythm  Gastrointestinal: soft, non-tender; bowel sounds normal; no masses,  no organomegaly.    Musculoskeletal: Steady gait and movement  Skin: Cool and moist  Psychiatric: Normal affect, non-agitated, not confused       LABS:   Status:  Final result  Visible to patient:  Yes (MyChart)  Dx:  Biliary calculus of other site withou...    Ref Range & Units 6d ago   Glucose 70 - 110 mg/dL 74    Sodium 136 - 145 mmol/L 142    Potassium 3.6 - 5.1 mmol/L 4.3    Chloride 97 - 109 mmol/L 103    Carbon Dioxide (CO2) 22.0 - 32.0 mmol/L 31.7    Urea Nitrogen (BUN) 7 - 25 mg/dL 14    Creatinine 0.6 - 1.1 mg/dL 0.9    Glomerular Filtration Rate (eGFR), MDRD Estimate >60 mL/min/1.73sq m 71    Calcium 8.7 - 10.3 mg/dL 10.3    AST  8 - 39 U/L 21  ALT  5 - 38 U/L 57High     Alk Phos (alkaline Phosphatase) 34 - 104 U/L 116High     Albumin 3.5 - 4.8 g/dL 4.5    Bilirubin, Total 0.3 - 1.2 mg/dL 0.5    Protein, Total 6.1 - 7.9 g/dL 7.3    A/G Ratio 1.0 - 5.0 gm/dL 1.6          RADS: Result Narrative  CLINICAL DATA:38 year old female with RIGHT-sided abdominal pain. Six weeks postpartum.  EXAM: ABDOMEN ULTRASOUND COMPLETE  COMPARISON:None.  FINDINGS: Gallbladder: Multiple small mobile  gallstones and gallbladder sludge identified. Mild gallbladder wall thickening is identified. No pericholecystic fluid or sonographic Murphy sign identified.  Common bile duct: Diameter: 3 mm. No intrahepatic or extrahepatic biliary dilatation.  Liver: A 3.7 x 2.9 x 3.3 cm ill-defined hypoechoic mass within the LEFT liver is noted. No other focal hepatic abnormalities are present. Portal vein is patent on color Doppler imaging with normal direction of blood flow towards the liver.  IVC: No abnormality visualized.  Pancreas: Visualized portion unremarkable.  Spleen: Size and appearance within normal limits.  Right Kidney: Length: 9.6 cm. Echogenicity within normal limits. No mass or hydronephrosis visualized.  Left Kidney: Length: 12 cm. Echogenicity within normal limits. No mass or hydronephrosis visualized.  Abdominal aorta: No aneurysm visualized.  Other findings: None.  IMPRESSION: 1. Cholelithiasis with mild gallbladder wall thickening, but without sonographic Murphy sign or pericholecystic fluid - equivocal for acute cholecystitis. Consider nuclear medicine study as clinically indicated. 2. 3.7 cm hypoechoic LEFT hepatic mass. MRI of the liver with and without contrast is recommended for further evaluation.   Electronically Signed ByEfraim Kaufmann M.D. On: 05/10/2018 11:01  Other Result Information  Interface, Rad Results In - 05/10/2018 11:04 AM EDT CLINICAL DATA:  37 year old female with RIGHT-sided abdominal pain. Six weeks postpartum.  EXAM: ABDOMEN ULTRASOUND COMPLETE  COMPARISON:  None.  FINDINGS: Gallbladder: Multiple small mobile gallstones and gallbladder sludge identified. Mild gallbladder wall thickening is identified. No pericholecystic fluid or sonographic Murphy sign identified.  Common bile duct: Diameter: 3 mm. No intrahepatic or extrahepatic biliary dilatation.  Liver: A 3.7 x 2.9 x 3.3 cm ill-defined hypoechoic mass within the LEFT  liver is noted. No other focal hepatic abnormalities are present. Portal vein is patent on color Doppler imaging with normal direction of blood flow towards the liver.  IVC: No abnormality visualized.  Pancreas: Visualized portion unremarkable.  Spleen: Size and appearance within normal limits.  Right Kidney: Length: 9.6 cm. Echogenicity within normal limits. No mass or hydronephrosis visualized.  Left Kidney: Length: 12 cm. Echogenicity within normal limits. No mass or hydronephrosis visualized.  Abdominal aorta: No aneurysm visualized.  Other findings: None.  IMPRESSION: 1. Cholelithiasis with mild gallbladder wall thickening, but without sonographic Murphy sign or pericholecystic fluid - equivocal for acute cholecystitis. Consider nuclear medicine study as clinically indicated. 2. 3.7 cm hypoechoic LEFT hepatic mass. MRI of the liver with and without contrast is recommended for further evaluation.   Electronically Signed   By: Margarette Canada M.D.   On: 05/10/2018 11:01   Procedure: MRI Abdomen with and without contrast  Indication: 14 years y/o Female with Liver lesion, > 1cm, [place] nondiagnostic, K76.9 Liver disease, unspecified patient with right upper quadrant pain and ultrasound demonstrating cholelithiasis and 3.7 cm hypoechoic left hepatic mass. The liver is normal in size and contour.  Comparison: None  Technique: Precontrast and dynamic postcontrast MR imaging of the abdomen was performed using the Liver Protocol. 13 mL  Multihance IV contrast was administered to improve disease detection and further define anatomy.  Premedication/adverse events: None.  Findings: The visualized lung is clear. No pleural or pericardial effusions.  The liver is normal in size and contour. There is a large 3.8 cm hyperenhancing lesion in the left hepatic lobe in hepatic segment 2 (series 10, image 24), which is mildly hyperintense on T2 and slightly hypointense on  T1-weighted images with relatively homogeneous arterial enhancement, which persists on the subsequent delayed images. There is a central linear focus of low T1 signal, which appears to enhance on the delayed images. No signal dropout on the opposed phase images to suggest microscopic fat. There is a smaller lesion in hepatic segment 4B measuring approximately 1.9 cm (series 10, image 28) which demonstrates similar enhancement characteristics. There is a small 0.6 cm focus of peripheral hyperenhancement (series 10, image 35), which becomes isointense on the more delayed images with no correlate on T2 weighted images, likely perfusional. The portal and hepatic veins are patent. No biliary ductal dilatation. The gallbladder is unremarkable.  The spleen, pancreas, bilateral adrenal glands are unremarkable. Symmetric cortical enhancement of the bilateral kidneys. No enhancing renal lesions. No hydronephrosis.  The visualized bowel and mesentery are unremarkable. No free fluid in the upper abdomen. The abdominal aorta is normal in caliber with patent branch vessels. No lymphadenopathy.  No aggressive osseous lesions.  Impression: There are two lesions in the left hepatic lobe measuring up to 3.8 cm, which are favored to be benign and likely represent Focal Nodular Hyperplasia. Hepatic adenoma is an additional differential consideration, although considered less likely. Recommend 6 month follow up MRI with [facility].   Electronically Reviewed by: Holley Raring, MD, [facility] Electronically Reviewed on: 05/18/2018 9:57 AM  I have reviewed the images and concur with the above findings.  Electronically Signed by: Louie Bun, MD, [facility] Electronically Signed on: 05/18/2018 1:13 PM  Assessment:      Right upper quadrant pain [R10.11]  [facility]NH on MRI  Plan:   1. Right upper quadrant pain [R10.11] Discussed the risk of surgery including post-op infxn, seroma, biloma,  chronic pain, poor-delayed wound healing, retained gallstone, conversion to open procedure, post-op SBO or ileus, and need for additional procedures to address said risks.  The risks of general anesthetic including MI, CVA, sudden death or even reaction to anesthetic medications also discussed. Alternatives include continued observation.  Benefits include possible symptom relief, prevention of complications including acute cholecystitis, pancreatitis.  Typical post operative recovery of 3-5 days rest, continued pain in area and incision sites, possible loose stools up to 4-6 weeks, also discussed.  ED return precautions given for sudden increase in RUQ pain, with possible accompanying fever, nausea, and/or vomiting.  The patient understands the risks, any and all questions were answered to the patient's satisfaction.  2. Will proceed with HIDA scan due to atypical presentation of back pain radiating to bilateral subcostal region and inconclusive [place], lab work.  May still consider lap chole even if HIDA is negative, since patient will like to figure out causes of these episodes before her maternity leave expires.  Both and patient and mom verbalized understanding that proceeding with surgery is no guarantee symptoms will resolve.  [place] of liver highly unlikely to be etiology of the pain at this point.  Agree with six month f/u with repeat MRI per GI recs.

## 2018-05-27 ENCOUNTER — Other Ambulatory Visit: Payer: Self-pay | Admitting: Obstetrics and Gynecology

## 2018-05-27 DIAGNOSIS — Z3A10 10 weeks gestation of pregnancy: Secondary | ICD-10-CM

## 2018-05-30 ENCOUNTER — Other Ambulatory Visit: Payer: Self-pay | Admitting: Obstetrics and Gynecology

## 2018-05-30 DIAGNOSIS — Z3A10 10 weeks gestation of pregnancy: Secondary | ICD-10-CM

## 2018-06-07 ENCOUNTER — Ambulatory Visit: Payer: BLUE CROSS/BLUE SHIELD

## 2018-06-09 ENCOUNTER — Encounter: Payer: Self-pay | Admitting: Surgery

## 2018-06-13 ENCOUNTER — Other Ambulatory Visit: Payer: Self-pay

## 2018-06-13 ENCOUNTER — Encounter
Admission: RE | Admit: 2018-06-13 | Discharge: 2018-06-13 | Disposition: A | Discharge status: Home / Self Care | Payer: BLUE CROSS/BLUE SHIELD | Source: Ambulatory Visit | Attending: Surgery | Admitting: Surgery

## 2018-06-13 DIAGNOSIS — Z01818 Encounter for other preprocedural examination: Secondary | ICD-10-CM | POA: Insufficient documentation

## 2018-06-13 NOTE — Patient Instructions (Signed)
Your procedure is scheduled on: Tuesday 06/20/18.   Report to DAY SURGERY DEPARTMENT LOCATED ON 2ND FLOOR MEDICAL MALL ENTRANCE. To find out your arrival time please call [phone] between 1PM - 3PM on Monday 06/19/18.    Remember: Instructions that are not followed completely may result in serious medical risk, up to and including death, or upon the discretion of your surgeon and anesthesiologist your surgery may need to be rescheduled.     _X__ 1. Do not eat food after midnight the night before your procedure.                 No gum chewing or hard candies. You may drink clear liquids up to 2 hours                 before you are scheduled to arrive for your surgery- DO NOT drink clear                 liquids within 2 hours of the start of your surgery.                 Clear Liquids include:  water, apple juice without pulp, clear carbohydrate                 drink such as Clearfast or Gatorade, Black Coffee or Tea (Do not add                 anything to coffee or tea).    __X__2.  On the morning of surgery brush your teeth with toothpaste and water, you may rinse your mouth with mouthwash if you wish.  Do not swallow any toothpaste or mouthwash.      __X__3.  Notify your doctor if there is any change in your medical condition      (cold, fever, infections).     Do not wear jewelry, make-up, hairpins, clips or nail polish. Do not wear lotions, powders, or perfumes.  Do not shave 48 hours prior to surgery. Men may shave face and neck. Do not bring valuables to the hospital.     [facility] any belongings or valuables.   Contacts, dentures/partials or body piercings may not be worn into surgery. Bring a case for your contacts, glasses or hearing aids, a denture cup will be supplied.     Patients discharged the day of surgery will not be allowed to drive home.     Please read over the following fact sheets that you were given:   MRSA  Information    __X__ Take these medicines the morning of surgery with A SIP OF WATER:     1. famotidine (PEPCID) 20 MG tablet (Take the night before and the morning of your surgery)   2. norethindrone (MICRONOR,CAMILA,ERRIN) 0.35 MG tablet  3. fluticasone (FLONASE) 50 MCG/ACT nasal spray if needed  3. cetirizine (ZYRTEC) 10 MG tablet if needed  4. acetaminophen (TYLENOL) 500 MG tablet if needed      __X__ Use CHG Soap as directed   __X__ Stop Anti-inflammatories TODAY such as Advil, Ibuprofen, Motrin, BC or Goodies Powder, Naprosyn, Naproxen, Aleve, Aspirin, Meloxicam. May take Tylenol if needed for pain or discomfort.   __X__ Bonita QuinYou may take your Vitamins up until the day before surgery. Last dose on Monday 06/19/18.

## 2018-06-14 ENCOUNTER — Ambulatory Visit (INDEPENDENT_AMBULATORY_CARE_PROVIDER_SITE_OTHER): Payer: BLUE CROSS/BLUE SHIELD | Admitting: Surgery

## 2018-06-14 ENCOUNTER — Encounter: Payer: Self-pay | Admitting: Surgery

## 2018-06-14 ENCOUNTER — Encounter: Payer: Self-pay | Admitting: *Deleted

## 2018-06-14 VITALS — BP 126/85 | HR 73 | Temp 97.2°F | Ht 67.0 in | Wt 141.0 lb

## 2018-06-14 DIAGNOSIS — K8020 Calculus of gallbladder without cholecystitis without obstruction: Secondary | ICD-10-CM

## 2018-06-14 NOTE — Progress Notes (Signed)
Patient's [facility] has been [facility] with Dr. Everlene FarrierPabon.  The patient has already completed Pre-admission requirements as she was originally scheduled for surgery with Dr. Tonna BoehringerSakai for 06-20-18. She presented in the office today for a second opinion. Patient wishes to proceed with surgery by Dr. Everlene FarrierPabon.  The patient is aware to be NPO after midnight evening prior to surgery, have a driver, and aware to call on 06-21-18 for arrival time.   The patient is aware to call the office should she have further questions.

## 2018-06-14 NOTE — Patient Instructions (Addendum)
Cholelithiasis Cholelithiasis is also called "gallstones." It is a kind of gallbladder disease. The gallbladder is an organ that stores a liquid (bile) that helps you digest fat. Gallstones may not cause symptoms (may be silent gallstones) until they cause a blockage, and then they can cause pain (gallbladder attack). Follow these instructions at home:  Take over-the-counter and prescription medicines only as told by your doctor.  Stay at a healthy weight.  Eat healthy foods. This includes: ? Eating fewer fatty foods, like fried foods. ? Eating fewer refined carbs (refined carbohydrates). Refined carbs are breads and grains that are highly processed, like white bread and white rice. Instead, choose whole grains like whole-wheat bread and brown rice. ? Eating more fiber. Almonds, fresh fruit, and beans are healthy sources of fiber.  Keep all follow-up visits as told by your doctor. This is important. Contact a doctor if:  You have sudden pain in the upper right side of your belly (abdomen). Pain might spread to your right shoulder or your chest. This may be a sign of a gallbladder attack.  You feel sick to your stomach (are nauseous).  You throw up (vomit).  You have been diagnosed with gallstones that have no symptoms and you get: ? Belly pain. ? Discomfort, burning, or fullness in the upper part of your belly (indigestion). Get help right away if:  You have sudden pain in the upper right side of your belly, and it lasts for more than 2 hours.  You have belly pain that lasts for more than 5 hours.  You have a fever or chills.  You keep feeling sick to your stomach or you keep throwing up.  Your skin or the whites of your eyes turn yellow (jaundice).  You have dark-colored pee (urine).  You have light-colored poop (stool). Summary  Cholelithiasis is also called "gallstones."  The gallbladder is an organ that stores a liquid (bile) that helps you digest fat.  Silent  gallstones are gallstones that do not cause symptoms.  A gallbladder attack may cause sudden pain in the upper right side of your belly. Pain might spread to your right shoulder or your chest. If this happens, contact your doctor.  If you have sudden pain in the upper right side of your belly that lasts for more than 2 hours, get help right away. This information is not intended to replace advice given to you by your health care provider. Make sure you discuss any questions you have with your health care provider. Document Released: 12/15/2007 Document Revised: 03/14/2016 Document Reviewed: 03/14/2016 Elsevier Interactive Patient Education  2017 Elsevier Inc.  

## 2018-06-15 ENCOUNTER — Telehealth: Payer: Self-pay | Admitting: *Deleted

## 2018-06-15 NOTE — Telephone Encounter (Signed)
Patient is scheduled for surgery on 06/22/18 with Dr.Pabon, patient is worried about cost and would like to get a estimate of what she has to pay, She is also concern about hospital part if they are going to cover it or not. Please call and advise

## 2018-06-15 NOTE — Progress Notes (Signed)
Patient ID: Maria Drake, female   DOB: 09/12/1980, 37 y.o.   MRN: [id number]  HPI Maria Drake is a 37 y.o. female in consultation at the request of MRS. Woodard PAC.  She is here for a second opinion.  She reports she had about 3 episodes of biliary colic that are characterized by an upper quadrant pain nausea and epigastric discomfort.  Reports that the pain is sharp intermittent moderate intensity.  He states that the first attack she had it when she was pregnant and self subsided.  Since then she has had 2 attacks.  No fevers no chills no evidence of biliary obstruction or jaundice.  Has has associated nausea and vomiting.  Her of the work-up has included an ultrasound that I have personally reviewed showing evidence of gallstones no cholecystitis, no evidence of choledocholithiasis on normal common bile duct.  Also a left hepatic mass.  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 He was he is completely normal on CMP only showing mild elevation of the alkaline phosphatase. Was evaluated previously by Dr. Sakai who requested a HIDA scan but since this involves nuclear medicine she was advised to be separated her baby for a couple of days.  She is not comfortable doing this. Is healthy and is able to perform more than 4 METS of activity without any shortness of breath or chest pain.  She did have an uneventful C-section approximately 3 months ago. She Does have an aunt and an uncle that have had cholecystectomy in the past with good results   HPI  Past Medical History:  Diagnosis Date  . Endometrial disorder   . Endometriosis   . Environmental allergies   . Gestational diabetes   . Gestational hypertension   . Headache   . Hyperthyroidism   . PCOS (polycystic ovarian syndrome)   . Tachycardia      Past Surgical History:  Procedure Laterality Date  . CESAREAN SECTION    . LAPAROSCOPIC ENDOMETRIOSIS FULGURATION    . WISDOM TOOTH EXTRACTION      Family History  Problem Relation Age of Onset  . Diabetes Mother   . Heart disease Father   . Diabetes Paternal Uncle   . Diabetes Maternal Grandfather   . Diabetes Paternal Grandfather   . Heart disease Paternal Grandfather   . Breast cancer Neg Hx   . Ovarian cancer Neg Hx     Social History Social History   Tobacco Use  . Smoking status: Never Smoker  . Smokeless tobacco: Never Used  Substance Use Topics  . Alcohol use: No  . Drug use: No    Allergies  Allergen Reactions  . Decongest-Aid [Pseudoephedrine] Anaphylaxis    tachycardia  . Codeine Nausea And Vomiting    Current Outpatient Medications  Medication Sig Dispense Refill  . acetaminophen (TYLENOL) 500 MG tablet Take 500 mg by mouth every 6 (six) hours as needed for moderate pain.     . Azelaic Acid (FINACEA) 15 % FOAM Apply 1 application topically daily as needed (acne break outs).     . cetirizine (ZYRTEC) 10 MG tablet Take 10 mg by mouth daily as needed for allergies.     . famotidine (PEPCID) 20 MG tablet Take 20 mg by mouth 2 (two) times daily.     . fluticasone (  FLONASE) 50 MCG/ACT nasal spray Place 2 sprays into the nose daily as needed for allergies.     . norethindrone (MICRONOR,CAMILA,ERRIN) 0.35 MG tablet Take 1 tablet (0.35 mg total) by mouth daily. 1 Package 11  . polyethylene glycol (MIRALAX / GLYCOLAX) packet Take 17 g by mouth daily as needed.    . Prenatal Vit-Fe Fumarate-FA (PRENATAL MULTIVITAMIN) TABS tablet Take 1 tablet by mouth daily at 12 noon.     No current facility-administered medications for this visit.      Review of Systems Full ROS  was asked and was negative except for the information on the HPI  Physical Exam Blood pressure 126/85, pulse 73, temperature (!) 97.2 F (36.2 C), temperature source Skin, height 5' 7" (1.702  m), weight 141 lb (64 kg), last menstrual period 07/08/2017, SpO2 98 %. CONSTITUTIONAL: NAD. EYES: Pupils are equal, round, and reactive to light, Sclera are non-icteric. EARS, NOSE, MOUTH AND THROAT: The oropharynx is clear. The oral mucosa is pink and moist. Hearing is intact to voice. LYMPH NODES:  Lymph nodes in the neck are normal. RESPIRATORY:  Lungs are clear. There is normal respiratory effort, with equal breath sounds bilaterally, and without pathologic use of accessory muscles. CARDIOVASCULAR: Heart is regular without murmurs, gallops, or rubs. GI: The abdomen is soft, nontender, and nondistended. There are no palpable masses. There is no hepatosplenomegaly. There are normal bowel sounds in all quadrants. GU: Rectal deferred.   MUSCULOSKELETAL: Normal muscle strength and tone. No cyanosis or edema.   SKIN: Turgor is good and there are no pathologic skin lesions or ulcers. NEUROLOGIC: Motor and sensation is grossly normal. Cranial nerves are grossly intact. PSYCH:  Oriented to person, place and time. Affect is normal.  Data Reviewed    Assessment/Plan  37-year-old female with right upper quadrant pain and cholelithiasis.  Clinical Findings are certainly consistent with biliary colic. Not necessarily think that she needs a HIDA scan.  Given her family history and her current clinical symptomatology it is likely that her gallbladder is giving her problems.  I had an extensive discussion with the patient about her disease process.  I do recommend cholecystectomy. The risks, benefits, complications, treatment options, and expected outcomes were discussed with the patient. The possibilities of bleeding, recurrent infection, finding a normal gallbladder, perforation of viscus organs, damage to surrounding structures, bile leak, abscess formation, needing a drain placed, the need for additional procedures, reaction to medication, pulmonary aspiration,  failure to diagnose a condition, the  possible need to convert to an open procedure, and creating a complication requiring transfusion or operation were discussed with the patient. The patient and/or family concurred with the proposed plan, giving informed consent.  He did have a lot of question regarding her recovery as well as her lactation.  All the questions were answered Plan For robotic assisted laparoscopic cholecystectomy Time spent with the patient was 70 minutes, with more than 50% of the time spent in face-to-face education, counseling and care coordination.   Copy of this report will be sent to the referring provider  Diego Pabon, MD FACS General Surgeon 06/15/2018, 1:06 PM   

## 2018-06-15 NOTE — H&P (View-Only) (Signed)
Patient ID: Maria Drake, female   DOB: 1980/12/13, 37 y.o.   MRN: [id number]  HPI Maria Drake is a 37 y.o. female in consultation at the request of Maria Drake PAC.  She is here for a second opinion.  She reports she had about 3 episodes of biliary colic that are characterized by an upper quadrant pain nausea and epigastric discomfort.  Reports that the pain is sharp intermittent moderate intensity.  He states that the first attack she had it when she was pregnant and self subsided.  Since then she has had 2 attacks.  No fevers no chills no evidence of biliary obstruction or jaundice.  Has has associated nausea and vomiting.  Her of the work-up has included an ultrasound that I have personally reviewed showing evidence of gallstones no cholecystitis, no evidence of choledocholithiasis on normal common bile duct.  Also a left hepatic mass.  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 He was he is completely normal on CMP only showing mild elevation of the alkaline phosphatase. Was evaluated previously by Maria Drake who requested a HIDA scan but since this involves nuclear medicine she was advised to be separated her baby for a couple of days.  She is not comfortable doing this. Is healthy and is able to perform more than 4 METS of activity without any shortness of breath or chest pain.  She did have an uneventful C-section approximately 3 months ago. She Does have an aunt and an uncle that have had cholecystectomy in the past with good results   HPI  Past Medical History:  Diagnosis Date  . Endometrial disorder   . Endometriosis   . Environmental allergies   . Gestational diabetes   . Gestational hypertension   . Headache   . Hyperthyroidism   . PCOS (polycystic ovarian syndrome)   . Tachycardia      Past Surgical History:  Procedure Laterality Date  . CESAREAN SECTION    . LAPAROSCOPIC ENDOMETRIOSIS FULGURATION    . WISDOM TOOTH EXTRACTION      Family History  Problem Relation Age of Onset  . Diabetes Mother   . Heart disease Father   . Diabetes Paternal Uncle   . Diabetes Maternal Grandfather   . Diabetes Paternal Grandfather   . Heart disease Paternal Grandfather   . Breast cancer Neg Hx   . Ovarian cancer Neg Hx     Social History Social History   Tobacco Use  . Smoking status: Never Smoker  . Smokeless tobacco: Never Used  Substance Use Topics  . Alcohol use: No  . Drug use: No    Allergies  Allergen Reactions  . Decongest-Aid [Pseudoephedrine] Anaphylaxis    tachycardia  . Codeine Nausea And Vomiting    Current Outpatient Medications  Medication Sig Dispense Refill  . acetaminophen (TYLENOL) 500 MG tablet Take 500 mg by mouth every 6 (six) hours as needed for moderate pain.     . Azelaic Acid (FINACEA) 15 % FOAM Apply 1 application topically daily as needed (acne break outs).     . cetirizine (ZYRTEC) 10 MG tablet Take 10 mg by mouth daily as needed for allergies.     . famotidine (PEPCID) 20 MG tablet Take 20 mg by mouth 2 (two) times daily.     . fluticasone (  FLONASE) 50 MCG/ACT nasal spray Place 2 sprays into the nose daily as needed for allergies.     Marland Kitchen norethindrone (MICRONOR,CAMILA,ERRIN) 0.35 MG tablet Take 1 tablet (0.35 mg total) by mouth daily. 1 Package 11  . polyethylene glycol (MIRALAX / GLYCOLAX) packet Take 17 g by mouth daily as needed.    . Prenatal Vit-Fe Fumarate-FA (PRENATAL MULTIVITAMIN) TABS tablet Take 1 tablet by mouth daily at 12 noon.     No current facility-administered medications for this visit.      Review of Systems Full ROS  was asked and was negative except for the information on the HPI  Physical Exam Blood pressure 126/85, pulse 73, temperature (!) 97.2 F (36.2 C), temperature source Skin, height 5\' 7"  (1.702  m), weight 141 lb (64 kg), last menstrual period 07/08/2017, SpO2 98 %. CONSTITUTIONAL: NAD. EYES: Pupils are equal, round, and reactive to light, Sclera are non-icteric. EARS, NOSE, MOUTH AND THROAT: The oropharynx is clear. The oral mucosa is pink and moist. Hearing is intact to voice. LYMPH NODES:  Lymph nodes in the neck are normal. RESPIRATORY:  Lungs are clear. There is normal respiratory effort, with equal breath sounds bilaterally, and without pathologic use of accessory muscles. CARDIOVASCULAR: Heart is regular without murmurs, gallops, or rubs. GI: The abdomen is soft, nontender, and nondistended. There are no palpable masses. There is no hepatosplenomegaly. There are normal bowel sounds in all quadrants. GU: Rectal deferred.   MUSCULOSKELETAL: Normal muscle strength and tone. No cyanosis or edema.   SKIN: Turgor is good and there are no pathologic skin lesions or ulcers. NEUROLOGIC: Motor and sensation is grossly normal. Cranial nerves are grossly intact. PSYCH:  Oriented to person, place and time. Affect is normal.  Data Reviewed    Assessment/Plan  37 year old female with right upper quadrant pain and cholelithiasis.  Clinical Findings are certainly consistent with biliary colic. Not necessarily think that she needs a HIDA scan.  Given her family history and her current clinical symptomatology it is likely that her gallbladder is giving her problems.  I had an extensive discussion with the patient about her disease process.  I do recommend cholecystectomy. The risks, benefits, complications, treatment options, and expected outcomes were discussed with the patient. The possibilities of bleeding, recurrent infection, finding a normal gallbladder, perforation of viscus organs, damage to surrounding structures, bile leak, abscess formation, needing a drain placed, the need for additional procedures, reaction to medication, pulmonary aspiration,  failure to diagnose a condition, the  possible need to convert to an open procedure, and creating a complication requiring transfusion or operation were discussed with the patient. The patient and/or family concurred with the proposed plan, giving informed consent.  He did have a lot of question regarding her recovery as well as her lactation.  All the questions were answered Plan For robotic assisted laparoscopic cholecystectomy Time spent with the patient was 70 minutes, with more than 50% of the time spent in face-to-face education, counseling and care coordination.   Copy of this report will be sent to the referring provider  Sterling Big, MD FACS General Surgeon 06/15/2018, 1:06 PM

## 2018-06-19 NOTE — Telephone Encounter (Signed)
I have called patient and left a message on VM. Patient has met deductible and OOP. The physician charge should be covered at 100%. Authorization is not needed for [id number] if being done out of pocket.

## 2018-06-20 ENCOUNTER — Ambulatory Visit: Admission: RE | Admit: 2018-06-20 | Payer: BLUE CROSS/BLUE SHIELD | Source: Ambulatory Visit | Admitting: Surgery

## 2018-06-20 ENCOUNTER — Telehealth: Payer: Self-pay | Admitting: *Deleted

## 2018-06-20 ENCOUNTER — Encounter: Admission: RE | Payer: Self-pay | Source: Ambulatory Visit

## 2018-06-20 SURGERY — LAPAROSCOPIC CHOLECYSTECTOMY
Anesthesia: General

## 2018-06-20 NOTE — Telephone Encounter (Signed)
Patient called back and I gave her the message about her estimate. She is now concern about the anesthesiologist part of it. She stated that her insurance company is giving her a fit about it and she can not get a straight answer if they will cover it or not.

## 2018-06-21 NOTE — Telephone Encounter (Signed)
Patient was advised to contact hospital billing and her insurance company as well.

## 2018-06-22 ENCOUNTER — Ambulatory Visit: Payer: BLUE CROSS/BLUE SHIELD | Admitting: Anesthesiology

## 2018-06-22 ENCOUNTER — Ambulatory Visit
Admission: RE | Admit: 2018-06-22 | Discharge: 2018-06-22 | Disposition: A | Discharge status: Home / Self Care | Payer: BLUE CROSS/BLUE SHIELD | Attending: Surgery | Admitting: Surgery

## 2018-06-22 ENCOUNTER — Encounter
Admission: RE | Disposition: A | Discharge status: Home / Self Care | Payer: Self-pay | Source: Home / Self Care | Attending: Surgery

## 2018-06-22 ENCOUNTER — Encounter: Payer: Self-pay | Admitting: *Deleted

## 2018-06-22 DIAGNOSIS — K8020 Calculus of gallbladder without cholecystitis without obstruction: Secondary | ICD-10-CM | POA: Diagnosis not present

## 2018-06-22 DIAGNOSIS — Z79899 Other long term (current) drug therapy: Secondary | ICD-10-CM | POA: Insufficient documentation

## 2018-06-22 DIAGNOSIS — E282 Polycystic ovarian syndrome: Secondary | ICD-10-CM | POA: Diagnosis not present

## 2018-06-22 DIAGNOSIS — Z7951 Long term (current) use of inhaled steroids: Secondary | ICD-10-CM | POA: Insufficient documentation

## 2018-06-22 DIAGNOSIS — Z793 Long term (current) use of hormonal contraceptives: Secondary | ICD-10-CM | POA: Insufficient documentation

## 2018-06-22 DIAGNOSIS — E119 Type 2 diabetes mellitus without complications: Secondary | ICD-10-CM | POA: Diagnosis not present

## 2018-06-22 DIAGNOSIS — E0590 Thyrotoxicosis, unspecified without thyrotoxic crisis or storm: Secondary | ICD-10-CM | POA: Insufficient documentation

## 2018-06-22 DIAGNOSIS — K8010 Calculus of gallbladder with chronic cholecystitis without obstruction: Secondary | ICD-10-CM | POA: Insufficient documentation

## 2018-06-22 DIAGNOSIS — I10 Essential (primary) hypertension: Secondary | ICD-10-CM | POA: Diagnosis not present

## 2018-06-22 DIAGNOSIS — K8050 Calculus of bile duct without cholangitis or cholecystitis without obstruction: Secondary | ICD-10-CM | POA: Diagnosis not present

## 2018-06-22 HISTORY — PX: ROBOTIC ASSISTED LAPAROSCOPIC CHOLECYSTECTOMY-MULTI SITE: SHX6603

## 2018-06-22 LAB — POCT PREGNANCY, URINE: Preg Test, Ur: NEGATIVE

## 2018-06-22 SURGERY — ROBOTIC ASSISTED LAPAROSCOPIC CHOLECYSTECTOMY-MULTI SITE
Anesthesia: General

## 2018-06-22 MED ORDER — DEXAMETHASONE SODIUM PHOSPHATE 10 MG/ML IJ SOLN
INTRAMUSCULAR | Status: DC | PRN
  Administered 2018-06-22: 10 mg via INTRAVENOUS

## 2018-06-22 MED ORDER — CEFAZOLIN SODIUM-DEXTROSE 2-4 GM/100ML-% IV SOLN
2.0000 g | INTRAVENOUS | Status: AC
  Administered 2018-06-22: 2 g via INTRAVENOUS

## 2018-06-22 MED ORDER — BUPIVACAINE HCL 0.25 % IJ SOLN
INTRAMUSCULAR | Status: DC | PRN
  Administered 2018-06-22: 30 mL

## 2018-06-22 MED ORDER — GABAPENTIN 300 MG PO CAPS
ORAL_CAPSULE | ORAL | Status: AC
  Administered 2018-06-22: 300 mg via ORAL
  Filled 2018-06-22: qty 1

## 2018-06-22 MED ORDER — ROCURONIUM BROMIDE 100 MG/10ML IV SOLN
INTRAVENOUS | Status: DC | PRN
  Administered 2018-06-22: 5 mg via INTRAVENOUS
  Administered 2018-06-22: 45 mg via INTRAVENOUS

## 2018-06-22 MED ORDER — HYDROCODONE-ACETAMINOPHEN 5-325 MG PO TABS
ORAL_TABLET | ORAL | Status: AC
  Filled 2018-06-22: qty 1

## 2018-06-22 MED ORDER — CHLORHEXIDINE GLUCONATE CLOTH 2 % EX PADS: 6.0000 | MEDICATED_PAD | Freq: Once | CUTANEOUS | Status: DC

## 2018-06-22 MED ORDER — ACETAMINOPHEN 500 MG PO TABS
1000.0000 mg | ORAL_TABLET | ORAL | Status: AC
  Administered 2018-06-22: 1000 mg via ORAL

## 2018-06-22 MED ORDER — FENTANYL CITRATE (PF) 100 MCG/2ML IJ SOLN
25.0000 ug | INTRAMUSCULAR | Status: DC | PRN
  Administered 2018-06-22 (×4): 25 ug via INTRAVENOUS

## 2018-06-22 MED ORDER — GABAPENTIN 300 MG PO CAPS
300.0000 mg | ORAL_CAPSULE | ORAL | Status: AC
  Administered 2018-06-22: 300 mg via ORAL

## 2018-06-22 MED ORDER — ACETAMINOPHEN 500 MG PO TABS: 1000.0000 mg | ORAL_TABLET | ORAL | Status: DC

## 2018-06-22 MED ORDER — CELECOXIB 200 MG PO CAPS
ORAL_CAPSULE | ORAL | Status: AC
  Administered 2018-06-22: 200 mg via ORAL
  Filled 2018-06-22: qty 1

## 2018-06-22 MED ORDER — LACTATED RINGERS IV SOLN
INTRAVENOUS | Status: DC
  Administered 2018-06-22 (×2): via INTRAVENOUS

## 2018-06-22 MED ORDER — FENTANYL CITRATE (PF) 100 MCG/2ML IJ SOLN
INTRAMUSCULAR | Status: DC | PRN
  Administered 2018-06-22 (×3): 50 ug via INTRAVENOUS

## 2018-06-22 MED ORDER — FENTANYL CITRATE (PF) 100 MCG/2ML IJ SOLN
INTRAMUSCULAR | Status: AC
  Filled 2018-06-22: qty 2

## 2018-06-22 MED ORDER — SUGAMMADEX SODIUM 200 MG/2ML IV SOLN
INTRAVENOUS | Status: DC | PRN
  Administered 2018-06-22: 256 mg via INTRAVENOUS

## 2018-06-22 MED ORDER — ONDANSETRON HCL 4 MG/2ML IJ SOLN
INTRAMUSCULAR | Status: AC
  Filled 2018-06-22: qty 2

## 2018-06-22 MED ORDER — ACETAMINOPHEN 500 MG PO TABS
ORAL_TABLET | ORAL | Status: AC
  Administered 2018-06-22: 1000 mg via ORAL
  Filled 2018-06-22: qty 2

## 2018-06-22 MED ORDER — LIDOCAINE HCL (CARDIAC) PF 100 MG/5ML IV SOSY
PREFILLED_SYRINGE | INTRAVENOUS | Status: DC | PRN
  Administered 2018-06-22: 60 mg via INTRAVENOUS

## 2018-06-22 MED ORDER — ONDANSETRON HCL 4 MG/2ML IJ SOLN
4.0000 mg | Freq: Once | INTRAMUSCULAR | Status: AC | PRN
  Administered 2018-06-22: 4 mg via INTRAVENOUS

## 2018-06-22 MED ORDER — CHLORHEXIDINE GLUCONATE CLOTH 2 % EX PADS
6.0000 | MEDICATED_PAD | Freq: Once | CUTANEOUS | Status: AC
  Administered 2018-06-22: 6 via TOPICAL

## 2018-06-22 MED ORDER — 0.9 % SODIUM CHLORIDE (POUR BTL) OPTIME
TOPICAL | Status: DC | PRN
  Administered 2018-06-22: 500 mL

## 2018-06-22 MED ORDER — BUPIVACAINE-EPINEPHRINE (PF) 0.25% -1:200000 IJ SOLN
INTRAMUSCULAR | Status: AC
  Filled 2018-06-22: qty 30

## 2018-06-22 MED ORDER — INDOCYANINE GREEN 25 MG IV SOLR
7.5000 mg | Freq: Once | INTRAVENOUS | Status: AC
  Administered 2018-06-22: 7.5 mg via INTRAVENOUS
  Filled 2018-06-22: qty 25

## 2018-06-22 MED ORDER — BUPIVACAINE HCL (PF) 0.25 % IJ SOLN
INTRAMUSCULAR | Status: AC
  Filled 2018-06-22: qty 30

## 2018-06-22 MED ORDER — MIDAZOLAM HCL 2 MG/2ML IJ SOLN
INTRAMUSCULAR | Status: DC | PRN
  Administered 2018-06-22: 2 mg via INTRAVENOUS

## 2018-06-22 MED ORDER — CEFAZOLIN SODIUM-DEXTROSE 2-4 GM/100ML-% IV SOLN: 2.0000 g | INTRAVENOUS | Status: DC

## 2018-06-22 MED ORDER — PROPOFOL 10 MG/ML IV BOLUS
INTRAVENOUS | Status: DC | PRN
  Administered 2018-06-22: 150 mg via INTRAVENOUS

## 2018-06-22 MED ORDER — CEFAZOLIN SODIUM-DEXTROSE 2-4 GM/100ML-% IV SOLN
INTRAVENOUS | Status: AC
  Filled 2018-06-22: qty 100

## 2018-06-22 MED ORDER — CELECOXIB 200 MG PO CAPS
200.0000 mg | ORAL_CAPSULE | ORAL | Status: AC
  Administered 2018-06-22: 200 mg via ORAL

## 2018-06-22 MED ORDER — HYDROCODONE-ACETAMINOPHEN 5-325 MG PO TABS: 1.0000 | ORAL_TABLET | Freq: Four times a day (QID) | ORAL | 0 refills | Status: DC | PRN

## 2018-06-22 MED ORDER — ONDANSETRON HCL 4 MG/2ML IJ SOLN
INTRAMUSCULAR | Status: DC | PRN
  Administered 2018-06-22: 4 mg via INTRAVENOUS

## 2018-06-22 MED ORDER — HYDROCODONE-ACETAMINOPHEN 5-325 MG PO TABS
1.0000 | ORAL_TABLET | Freq: Four times a day (QID) | ORAL | Status: DC | PRN
  Administered 2018-06-22: 1 via ORAL

## 2018-06-22 SURGICAL SUPPLY — 43 items
ADH SKN CLS APL DERMABOND .7 (GAUZE/BANDAGES/DRESSINGS) ×1
BAG SPEC RTRVL LRG 6X4 10 (ENDOMECHANICALS) ×1
CANISTER SUCT 1200ML W/VALVE (MISCELLANEOUS) ×3 IMPLANT
CHLORAPREP W/TINT 26ML (MISCELLANEOUS) ×3 IMPLANT
CLIP VESOLOCK MED LG 6/CT (CLIP) ×6 IMPLANT
CORD BIP STRL DISP 12FT (MISCELLANEOUS) ×3 IMPLANT
COVER TIP SHEARS 8 DVNC (MISCELLANEOUS) IMPLANT
COVER TIP SHEARS 8MM DA VINCI (MISCELLANEOUS)
COVER WAND RF STERILE (DRAPES) ×3 IMPLANT
DECANTER SPIKE VIAL GLASS SM (MISCELLANEOUS) ×1 IMPLANT
DEFOGGER SCOPE WARMER CLEARIFY (MISCELLANEOUS) ×3 IMPLANT
DERMABOND ADVANCED (GAUZE/BANDAGES/DRESSINGS) ×2
DERMABOND ADVANCED .7 DNX12 (GAUZE/BANDAGES/DRESSINGS) ×1 IMPLANT
DRAPE SHEET LG 3/4 BI-LAMINATE (DRAPES) ×3 IMPLANT
ELECT REM PT RETURN 9FT ADLT (ELECTROSURGICAL) ×3
ELECTRODE REM PT RTRN 9FT ADLT (ELECTROSURGICAL) ×1 IMPLANT
GLOVE BIO SURGEON STRL SZ7 (GLOVE) ×6 IMPLANT
GOWN STRL REUS W/ TWL LRG LVL3 (GOWN DISPOSABLE) ×4 IMPLANT
GOWN STRL REUS W/TWL LRG LVL3 (GOWN DISPOSABLE) ×12
IRRIGATION STRYKERFLOW (MISCELLANEOUS) IMPLANT
IRRIGATOR STRYKERFLOW (MISCELLANEOUS)
IV NS 1000ML (IV SOLUTION) ×3
IV NS 1000ML BAXH (IV SOLUTION) ×1 IMPLANT
KIT ACCESSORY DA VINCI DISP (KITS) ×2
KIT ACCESSORY DVNC DISP (KITS) ×1 IMPLANT
KIT PINK PAD W/HEAD ARE REST (MISCELLANEOUS) ×3
KIT PINK PAD W/HEAD ARM REST (MISCELLANEOUS) ×1 IMPLANT
LABEL OR SOLS (LABEL) ×3 IMPLANT
NEEDLE HYPO 22GX1.5 SAFETY (NEEDLE) ×3 IMPLANT
NS IRRIG 500ML POUR BTL (IV SOLUTION) ×3 IMPLANT
PACK LAP CHOLECYSTECTOMY (MISCELLANEOUS) ×3 IMPLANT
PENCIL ELECTRO HAND CTR (MISCELLANEOUS) ×3 IMPLANT
POUCH SPECIMEN RETRIEVAL 10MM (ENDOMECHANICALS) ×3 IMPLANT
PROGRASP ENDOWRIST DA VINCI (INSTRUMENTS)
PROGRASP ENDOWRIST DVNC (INSTRUMENTS) IMPLANT
SOLUTION ELECTROLUBE (MISCELLANEOUS) ×3 IMPLANT
SPONGE LAP 18X18 RF (DISPOSABLE) ×3 IMPLANT
STRAP SAFETY 5IN WIDE (MISCELLANEOUS) ×3 IMPLANT
SUT MNCRL AB 4-0 PS2 18 (SUTURE) ×3 IMPLANT
SUT VICRYL 0 AB UR-6 (SUTURE) ×6 IMPLANT
TROCAR 130MM GELPORT  DAV (MISCELLANEOUS) ×3 IMPLANT
TROCAR XCEL NON-BLD 5MMX100MML (ENDOMECHANICALS) ×3 IMPLANT
TUBING INSUF HEATED (TUBING) ×3 IMPLANT

## 2018-06-22 NOTE — Anesthesia Post-op Follow-up Note (Signed)
Anesthesia QCDR form completed.        

## 2018-06-22 NOTE — Transfer of Care (Signed)
Immediate Anesthesia Transfer of Care Note  Patient: Maria HawkingKathryn B Manchester  Procedure(s) Performed: ROBOTIC ASSISTED LAPAROSCOPIC CHOLECYSTECTOMY-MULTI SITE (N/A )  Patient Location: PACU  Anesthesia Type:General  Level of Consciousness: drowsy  Airway & Oxygen Therapy: Patient Spontanous Breathing and Patient connected to nasal cannula oxygen  Post-op Assessment: Report given to RN and Post -op Vital signs reviewed and stable  Post vital signs: stable  Last Vitals:  Vitals Value Taken Time  BP 134/90 06/22/2018  9:24 AM  Temp 36.1 C 06/22/2018  9:23 AM  Pulse 86 06/22/2018  9:30 AM  Resp 13 06/22/2018  9:30 AM  SpO2 100 % 06/22/2018  9:30 AM  Vitals shown include unvalidated device data.  Last Pain:  Vitals:   06/22/18 0923  TempSrc:   PainSc: 0-No pain         Complications: No apparent anesthesia complications

## 2018-06-22 NOTE — Interval H&P Note (Signed)
History and Physical Interval Note:  06/22/2018 7:26 AM  Maria Drake  has presented today for surgery, with the diagnosis of BILIARY COLIE  The various methods of treatment have been discussed with the patient and family. After consideration of risks, benefits and other options for treatment, the patient has consented to  Procedure(s): ROBOTIC ASSISTED LAPAROSCOPIC CHOLECYSTECTOMY-MULTI SITE (N/A) as a surgical intervention .  The patient's history has been reviewed, patient examined, no change in status, stable for surgery.  I have reviewed the patient's chart and labs.  Questions were answered to the patient's satisfaction.     [name] F Keirra Zeimet

## 2018-06-22 NOTE — Discharge Instructions (Addendum)
AMBULATORY SURGERY  DISCHARGE INSTRUCTIONS   1) The drugs that you were given will stay in your system until tomorrow so for the next 24 hours you should not:  A) Drive an automobile B) Make any legal decisions C) Drink any alcoholic beverage   2) You may resume regular meals tomorrow.  Today it is better to start with liquids and gradually work up to solid foods.  You may eat anything you prefer, but it is better to start with liquids, then soup and crackers, and gradually work up to solid foods.   3) Please notify your doctor immediately if you have any unusual bleeding, trouble breathing, redness and pain at the surgery site, drainage, fever, or pain not relieved by medication.    4) Additional Instructions:        Please contact your physician with any problems or Same Day Surgery at [phone], Monday through Friday 6 am to 4 pm, or [facility] number at [phone].Laparoscopic Cholecystectomy, Care After   These instructions give you information on caring for yourself after your procedure. Your doctor may also give you more specific instructions. Call your doctor if you have any problems or questions after your procedure.  HOME CARE  Change your bandages (dressings) as told by your doctor.  Keep the wound dry and clean. Wash the wound gently with soap and water. Pat the wound dry with a clean towel.  Do not take baths, swim, or use hot tubs for 2 weeks, or as told by your doctor.  Only take medicine as told by your doctor.  Eat a normal diet as told by your doctor.  Do not lift anything heavier than 10 pounds (4.5 kg) until your doctor says it is okay.  Do not play contact sports for 1 week, or as told by your doctor. GET HELP IF:  Your wound is red, puffy (swollen), or painful.  You have yellowish-white fluid (pus) coming from the wound.  You have fluid draining from the wound for more than 1 day.  You have a bad smell coming from the wound.   Your wound breaks open. GET HELP RIGHT AWAY IF:  You have trouble breathing.  You have chest pain.  You have a fever >101  You have pain in the shoulders (shoulder strap areas) that is getting worse.  You feel dizzy or pass out (faint).  You have severe belly (abdominal) pain.  You feel sick to your stomach (nauseous) or throw up (vomit) for more than 1 day.   

## 2018-06-22 NOTE — Anesthesia Preprocedure Evaluation (Signed)
Anesthesia Evaluation  Patient identified by MRN, date of birth, ID band Patient awake    Reviewed: Allergy & Precautions, H&P , NPO status , Patient's Chart, lab work & pertinent test results, reviewed documented beta blocker date and time   Airway Mallampati: II  TM Distance: >3 FB Neck ROM: full    Dental  (+) Teeth Intact   Pulmonary neg pulmonary ROS,    Pulmonary exam normal        Cardiovascular Exercise Tolerance: Good hypertension, On Medications negative cardio ROS Normal cardiovascular exam Rhythm:regular Rate:Normal     Neuro/Psych  Headaches, Anxiety negative neurological ROS  negative psych ROS   GI/Hepatic negative GI ROS, Neg liver ROS,   Endo/Other  negative endocrine ROSdiabetesHyperthyroidism   Renal/GU negative Renal ROS  negative genitourinary   Musculoskeletal   Abdominal   Peds  Hematology negative hematology ROS (+)   Anesthesia Other Findings Past Medical History: No date: Endometrial disorder No date: Endometriosis No date: Environmental allergies No date: Gestational diabetes No date: Gestational hypertension No date: Headache No date: Hyperthyroidism No date: PCOS (polycystic ovarian syndrome) No date: Tachycardia Past Surgical History: No date: CESAREAN SECTION No date: LAPAROSCOPIC ENDOMETRIOSIS FULGURATION No date: WISDOM TOOTH EXTRACTION   Reproductive/Obstetrics negative OB ROS                             Anesthesia Physical Anesthesia Plan  ASA: II  Anesthesia Plan: General ETT   Post-op Pain Management:    Induction:   PONV Risk Score and Plan:   Airway Management Planned:   Additional Equipment:   Intra-op Plan:   Post-operative Plan:   Informed Consent: I have reviewed the patients History and Physical, chart, labs and discussed the procedure including the risks, benefits and alternatives for the proposed anesthesia with the  patient or authorized representative who has indicated his/her understanding and acceptance.   Dental Advisory Given  Plan Discussed with: CRNA  Anesthesia Plan Comments:         Anesthesia Quick Evaluation

## 2018-06-22 NOTE — Op Note (Signed)
Robotic assisted laparoscopic Cholecystectomy  Pre-operative Diagnosis: biliary colic  Post-operative Diagnosis: same  Procedure: Robotic assisted laparoscopic cholecystectomy  Surgeon: Sterling Bigiego Seward Coran, MD FACS  Anesthesia: Gen. with endotracheal tube  Findings: chronicCholecystitis   Estimated Blood Loss: 5cc         Drains: none         Specimens: Gallbladder           Complications: none   Procedure Details  The patient was seen again in the Holding Room. The benefits, complications, treatment options, and expected outcomes were discussed with the patient. The risks of bleeding, infection, recurrence of symptoms, failure to resolve symptoms, bile duct damage, bile duct leak, retained common bile duct stone, bowel injury, any of which could require further surgery and/or ERCP, stent, or papillotomy were reviewed with the patient. The likelihood of improving the patient's symptoms with return to their baseline status is good.  The patient and/or family concurred with the proposed plan, giving informed consent.  The patient was taken to Operating Room, identified as Rosalin HawkingKathryn B Raper and the procedure verified as Laparoscopic Cholecystectomy.  A Time Out was held and the above information confirmed.  Prior to the induction of general anesthesia, antibiotic prophylaxis was administered. VTE prophylaxis was in place. General endotracheal anesthesia was then administered and tolerated well. After the induction, the abdomen was prepped with Chloraprep and draped in the sterile fashion. The patient was positioned in the supine position.  Cut down technique was used to enter the abdominal cavity and a Hasson trochar was placed after two vicryl stitches were anchored to the fascia. Pneumoperitoneum was then created with CO2 and tolerated well without any adverse changes in the patient's vital signs.  Three 8-mm ports were placed  under direct vision. All skin incisions  were infiltrated with a  local anesthetic agent before making the incision and placing the trocars.   The patient was positioned  in reverse Trendelenburg, the robot was brought to the surgical field and docked in the standard fashion.  We made sure there was no collision between the [facility] and that all instrumentation was kept under visualization at all times. I Scrubbed out and went to the console.  The gallbladder was identified, the fundus grasped and retracted cephalad. Adhesions were lysed bluntly. The infundibulum was grasped and retracted laterally, exposing the peritoneum overlying the triangle of Calot. This was then divided and exposed in a blunt fashion. An extended critical view of the cystic duct and cystic artery was obtained.  The cystic duct was clearly identified and bluntly dissected.   Artery and duct were double clipped and divided. TEE cholangiography was used throughout the case and we did not identify any aberrant biliary ducts and there was no evidence of any bile leak at any time  The gallbladder was taken from the gallbladder fossa in a retrograde fashion with the electrocautery. The gallbladder was removed and placed in an Endocatch bag.Hemostasis was achieved with the electrocautery.   I scrubbed back in.  Removed the gallbladder, Inspection of the right upper quadrant was performed. No bleeding, bile duct injury or leak, or bowel injury was noted. Pneumoperitoneum was released.  The periumbilical port site was closed with interrumpted 0 Vicryl sutures. 4-0 subcuticular Monocryl was used to close the skin. Dermabond was  applied.  The patient was then extubated and brought to the recovery room in stable condition. Sponge, lap, and needle counts were correct at closure and at the conclusion of the case.  Caroleen Hamman, MD, FACS

## 2018-06-22 NOTE — Anesthesia Procedure Notes (Signed)
Procedure Name: Intubation Date/Time: 06/22/2018 8:00 AM Performed by: Lavone Orn, CRNA Pre-anesthesia Checklist: Patient identified, Emergency Drugs available, Suction available, Patient being monitored and Timeout performed Patient Re-evaluated:Patient Re-evaluated prior to induction Oxygen Delivery Method: Circle system utilized Preoxygenation: Pre-oxygenation with 100% oxygen Induction Type: IV induction and Cricoid Pressure applied Ventilation: Mask ventilation without difficulty Laryngoscope Size: Mac and 3 Grade View: Grade II Tube type: Oral Tube size: 7.0 mm Number of attempts: 1 Airway Equipment and Method: Stylet Placement Confirmation: ETT inserted through vocal cords under direct vision,  positive ETCO2 and breath sounds checked- equal and bilateral Secured at: 22 cm Tube secured with: Tape Dental Injury: Teeth and Oropharynx as per pre-operative assessment

## 2018-06-23 ENCOUNTER — Encounter: Payer: Self-pay | Admitting: Surgery

## 2018-06-23 LAB — SURGICAL PATHOLOGY

## 2018-06-26 ENCOUNTER — Telehealth: Payer: Self-pay

## 2018-06-26 NOTE — Telephone Encounter (Signed)
Patient called had questions regarding lifting and carrying her baby of 14 pounds.   She stated Dr.Pabon had told her she could hold and carry her baby if someone handed the baby to her but he did not want her to bend over and pick up the baby from the bed, changing table  She stated she was told she coulg not to any of the above listed things by a nurse at the hospital. She was instructed to follow what Dr.Pabon had previously discussed with her.

## 2018-06-26 NOTE — Anesthesia Postprocedure Evaluation (Signed)
Anesthesia Post Note  Patient: Maria Drake  Procedure(s) Performed: ROBOTIC ASSISTED LAPAROSCOPIC CHOLECYSTECTOMY-MULTI SITE (N/A )  Patient location during evaluation: PACU Anesthesia Type: General Level of consciousness: awake and alert Pain management: pain level controlled Vital Signs Assessment: post-procedure vital signs reviewed and stable Respiratory status: spontaneous breathing, nonlabored ventilation, respiratory function stable and patient connected to nasal cannula oxygen Cardiovascular status: blood pressure returned to baseline and stable Postop Assessment: no apparent nausea or vomiting Anesthetic complications: no     Last Vitals:  Vitals:   06/22/18 1014 06/22/18 1250  BP: 130/87 (P) 117/76  Pulse: 88 (P) 85  Resp: 16 (P) 16  Temp: (!) 36.3 C   SpO2: 100% (P) 99%    Last Pain:  Vitals:   06/23/18 0907  TempSrc:   PainSc: 3                  Yevette EdwardsJames G Kasey Hansell

## 2018-06-28 ENCOUNTER — Ambulatory Visit (INDEPENDENT_AMBULATORY_CARE_PROVIDER_SITE_OTHER): Payer: BLUE CROSS/BLUE SHIELD | Admitting: Surgery

## 2018-06-28 ENCOUNTER — Encounter: Payer: Self-pay | Admitting: Surgery

## 2018-06-28 ENCOUNTER — Other Ambulatory Visit: Payer: Self-pay

## 2018-06-28 VITALS — BP 118/78 | HR 84 | Temp 97.7°F | Resp 16 | Ht 63.0 in | Wt 150.2 lb

## 2018-06-28 DIAGNOSIS — Z09 Encounter for follow-up examination after completed treatment for conditions other than malignant neoplasm: Secondary | ICD-10-CM

## 2018-06-28 NOTE — Progress Notes (Signed)
S/p robotic chole 12/12 Doing very well Taking Po Not on any narcotics No fevers or chills  PE NAD Abd: soft, nt, incisions c/d/i, no infection  Path d/w pt  A/P Doing very well No complications No heavy lifting for total of 6 weeks RTC prn

## 2018-06-28 NOTE — Patient Instructions (Signed)

## 2018-07-03 ENCOUNTER — Ambulatory Visit: Payer: BLUE CROSS/BLUE SHIELD | Admitting: Surgery

## 2018-09-05 ENCOUNTER — Encounter: Payer: BLUE CROSS/BLUE SHIELD | Admitting: Obstetrics and Gynecology

## 2018-10-04 ENCOUNTER — Telehealth: Payer: Self-pay

## 2018-10-04 NOTE — Telephone Encounter (Signed)
Pt called to rescheduled her appointment. Pt rescheduled for a later date due to her son having a MRI appt on her scheduled appt date.

## 2018-10-10 ENCOUNTER — Telehealth: Payer: Self-pay

## 2018-10-10 NOTE — Telephone Encounter (Signed)
Pt called and went over the information given by [facility] concerning pt continuing to take Micronor after breastfeeding. Pt was informed that if she was to continue taking Micronor after breastfeeding that she needed to take the medication at the same time everyday due to she could be at high risk for getting pregnant. Pt as advised that if she think she could not do that she could call the office and [facility] would give her another form of birth control. Pt stated that she would just like to stay on what is on right now.

## 2018-10-11 ENCOUNTER — Encounter: Payer: BLUE CROSS/BLUE SHIELD | Admitting: Obstetrics and Gynecology

## 2018-11-15 ENCOUNTER — Telehealth: Payer: Self-pay | Admitting: Obstetrics and Gynecology

## 2018-11-15 NOTE — Telephone Encounter (Signed)
Patient is having a vaginal odor and itching. She has not had any discharge. This has been going on since Sunday. She is nursing. I told her that she could try monistat over the container. Would you like her to be seen?

## 2018-11-15 NOTE — Telephone Encounter (Signed)
The patient called and stated that she is currently breast feeding and has a yeast infection. The patient is hoping to have something called into her preferred pharmacy, [facility] in [place]. Please advise.

## 2018-11-16 NOTE — Telephone Encounter (Signed)
Pt called no answer LM via voicemail the if the monistat is not helping to call and make an appointment to be seen next week.

## 2018-11-16 NOTE — Telephone Encounter (Signed)
If the Monistat does not help, she can be seen in the office next week.

## 2018-11-17 NOTE — Telephone Encounter (Signed)
LM for patient to return call.

## 2018-11-18 ENCOUNTER — Telehealth: Payer: BLUE CROSS/BLUE SHIELD | Admitting: Physician Assistant

## 2018-11-18 DIAGNOSIS — R300 Dysuria: Secondary | ICD-10-CM

## 2018-11-18 DIAGNOSIS — N898 Other specified noninflammatory disorders of vagina: Secondary | ICD-10-CM

## 2018-11-18 NOTE — Progress Notes (Signed)
  It is too difficult to discern exactly where the problem is in the online format.To complicate matters, any anitbioitc can cause side effects to baby with breast feeding, thus we need to know exactly where the problem is in order weigh the benefits and the risk to you and baby.Please make an appointment with you OB next week.You may also want to consider going to the urgent care today where testing can be done.Either way, you need to be seen for this illness.I am sorry to complicate matters for you.You will not be charged for this evisit.

## 2018-11-21 NOTE — Telephone Encounter (Signed)
Patient called back and she had more symptoms over the weekend. She stated that she had some vaginal itching and burning internally. She also started having some abdominal pain. I have scheduled her to come in tomorrow to be seen. She did not try the monistat and she had e visit this weekend. They did not treat her symptoms.

## 2018-11-22 ENCOUNTER — Other Ambulatory Visit: Payer: Self-pay

## 2018-11-22 ENCOUNTER — Ambulatory Visit: Payer: BLUE CROSS/BLUE SHIELD | Admitting: Obstetrics and Gynecology

## 2018-11-22 ENCOUNTER — Encounter: Payer: Self-pay | Admitting: Obstetrics and Gynecology

## 2018-11-22 VITALS — BP 123/80 | HR 73 | Ht 63.0 in | Wt 142.5 lb

## 2018-11-22 DIAGNOSIS — B9689 Other specified bacterial agents as the cause of diseases classified elsewhere: Secondary | ICD-10-CM | POA: Diagnosis not present

## 2018-11-22 DIAGNOSIS — N760 Acute vaginitis: Secondary | ICD-10-CM | POA: Diagnosis not present

## 2018-11-22 DIAGNOSIS — R309 Painful micturition, unspecified: Secondary | ICD-10-CM

## 2018-11-22 LAB — POCT URINALYSIS DIPSTICK
Bilirubin, UA: NEGATIVE
Blood, UA: NEGATIVE
Glucose, UA: NEGATIVE
Ketones, UA: NEGATIVE
Leukocytes, UA: NEGATIVE
Nitrite, UA: NEGATIVE
Protein, UA: NEGATIVE
Spec Grav, UA: <=1.005 — AB (ref 1.010–1.025)
Urobilinogen, UA: 0.2 E.U./dL
pH, UA: 7 (ref 5.0–8.0)

## 2018-11-22 MED ORDER — METRONIDAZOLE 500 MG PO TABS: 500.0000 mg | ORAL_TABLET | Freq: Two times a day (BID) | ORAL | 0 refills | Status: DC

## 2018-11-22 MED ORDER — FLUCONAZOLE 150 MG PO TABS: 150.0000 mg | ORAL_TABLET | Freq: Once | ORAL | 3 refills | Status: AC

## 2018-11-22 NOTE — Patient Instructions (Signed)
Bacterial Vaginosis    Bacterial vaginosis is a vaginal infection that occurs when the normal balance of bacteria in the vagina is disrupted. It results from an overgrowth of certain bacteria. This is the most common vaginal infection among women ages 15-44.  Because bacterial vaginosis increases your risk for STIs (sexually transmitted infections), getting treated can help reduce your risk for chlamydia, gonorrhea, herpes, and HIV (human immunodeficiency virus). Treatment is also important for preventing complications in pregnant women, because this condition can cause an early (premature) delivery.  What are the causes?  This condition is caused by an increase in harmful bacteria that are normally present in small amounts in the vagina. However, the reason that the condition develops is not fully understood.  What increases the risk?  The following factors may make you more likely to develop this condition:  · Having a new sexual partner or multiple sexual partners.  · Having unprotected sex.  · Douching.  · Having an intrauterine device (IUD).  · Smoking.  · Drug and alcohol abuse.  · Taking certain antibiotic medicines.  · Being pregnant.  You cannot get bacterial vaginosis from toilet seats, bedding, swimming pools, or contact with objects around you.  What are the signs or symptoms?  Symptoms of this condition include:  · Grey or white vaginal discharge. The discharge can also be watery or foamy.  · A fish-like odor with discharge, especially after sexual intercourse or during menstruation.  · Itching in and around the vagina.  · Burning or pain with urination.  Some women with bacterial vaginosis have no signs or symptoms.  How is this diagnosed?  This condition is diagnosed based on:  · Your medical history.  · A physical exam of the vagina.  · Testing a sample of vaginal fluid under a microscope to look for a large amount of bad bacteria or abnormal cells. Your health care provider may use a cotton swab or  a small wooden spatula to collect the sample.  How is this treated?  This condition is treated with antibiotics. These may be given as a pill, a vaginal cream, or a medicine that is put into the vagina (suppository). If the condition comes back after treatment, a second round of antibiotics may be needed.  Follow these instructions at home:  Medicines  · Take over-the-counter and prescription medicines only as told by your health care provider.  · Take or use your antibiotic as told by your health care provider. Do not stop taking or using the antibiotic even if you start to feel better.  General instructions  · If you have a female sexual partner, tell her that you have a vaginal infection. She should see her health care provider and be treated if she has symptoms. If you have a female sexual partner, he does not need treatment.  · During treatment:  ? Avoid sexual activity until you finish treatment.  ? Do not douche.  ? Avoid alcohol as directed by your health care provider.  ? Avoid breastfeeding as directed by your health care provider.  · Drink enough water and fluids to keep your urine clear or pale yellow.  · Keep the area around your vagina and rectum clean.  ? Wash the area daily with warm water.  ? Wipe yourself from front to back after using the toilet.  · Keep all follow-up visits as told by your health care provider. This is important.  How is this prevented?  · Do not   douche.  · Wash the outside of your vagina with warm water only.  · Use protection when having sex. This includes latex condoms and dental dams.  · Limit how many sexual partners you have. To help prevent bacterial vaginosis, it is best to have sex with just one partner (monogamous).  · Make sure you and your sexual partner are tested for STIs.  · Wear cotton or cotton-lined underwear.  · Avoid wearing tight pants and pantyhose, especially during summer.  · Limit the amount of alcohol that you drink.  · Do not use any products that contain  nicotine or tobacco, such as cigarettes and e-cigarettes. If you need help quitting, ask your health care provider.  · Do not use illegal drugs.  Where to find more information  · Centers for Disease Control and Prevention: www.cdc.gov/std  · American Sexual Health Association (ASHA): www.ashastd.org  · U.S. Department of Health and Human Services, Office on Women's Health: www.womenshealth.gov/ or https://www.womenshealth.gov/a-z-topics/bacterial-vaginosis  Contact a health care provider if:  · Your symptoms do not improve, even after treatment.  · You have more discharge or pain when urinating.  · You have a fever.  · You have pain in your abdomen.  · You have pain during sex.  · You have vaginal bleeding between periods.  Summary  · Bacterial vaginosis is a vaginal infection that occurs when the normal balance of bacteria in the vagina is disrupted.  · Because bacterial vaginosis increases your risk for STIs (sexually transmitted infections), getting treated can help reduce your risk for chlamydia, gonorrhea, herpes, and HIV (human immunodeficiency virus). Treatment is also important for preventing complications in pregnant women, because the condition can cause an early (premature) delivery.  · This condition is treated with antibiotic medicines. These may be given as a pill, a vaginal cream, or a medicine that is put into the vagina (suppository).  This information is not intended to replace advice given to you by your health care provider. Make sure you discuss any questions you have with your health care provider.  Document Released: 06/28/2005 Document Revised: 11/01/2016 Document Reviewed: 03/13/2016  Elsevier Interactive Patient Education © 2019 [employer] Inc.

## 2018-11-22 NOTE — Progress Notes (Signed)
Pt is present today for having vaginal burning and itching x [redacted] week along with odor inside the vaginal.

## 2018-11-22 NOTE — Progress Notes (Signed)
    GYNECOLOGY PROGRESS NOTE  Subjective:    Patient ID: Maria Drake, female    DOB: 09-Feb-1981, 38 y.o.   MRN: [id number]  HPI  Patient is a 38 y.o. G54P1001 female who presents for complaints of vaginal burning and itching for approximately 1.[redacted] weeks along with a vaginal odor that wax and wanes.  Patient denies vaginal discharge.  She states that she initially thought it was a yeast infection.  Was instructed by our office to try Monistat over-the-counter, however patient notes on that same day that her symptoms seem to abate and so she did not take medication as recommended.  She notes that her symptoms started back approximately 3 days ago.  She was seen last Friday during a visit with her primary care provider via e-visit, however notes that the visit was canceled as they did not feel that it was beneficial and recommended that she be seen in person.  Today she notes also 1-2 days of mild pain with urination and pink tinged blood when wiping. Has been drinking cranberry juice for several days. STD exposure risk: low.   The following portions of the patient's history were reviewed and updated as appropriate: allergies, current medications, past family history, past medical history, past social history, past surgical history and problem list.  Review of Systems Pertinent items noted in HPI and remainder of comprehensive ROS otherwise negative.   Objective:   Blood pressure 123/80, pulse 73, height 5\' 3"  (1.6 m), weight 142 lb 8 oz (64.6 kg), currently breastfeeding. General appearance: alert and no distress Abdomen: soft, non-tender; bowel sounds normal; no masses,  no organomegaly Pelvic: external genitalia normal, rectovaginal septum normal.  Vagina with scant thin white discharge, no odor.  Cervix normal appearing, no lesions and no motion tenderness.  Bimanual exam not done.   Microscopic wet-mount exam shows few clue cells, no hyphae, no trichomonads, no white blood cells. KOH done.    Results for orders placed or performed in visit on 11/22/18  POCT urinalysis dipstick  Result Value Ref Range   Color, UA yellow    Clarity, UA clear    Glucose, UA Negative Negative   Bilirubin, UA neg    Ketones, UA neg    Spec Grav, UA <=1.005 (A) 1.010 - 1.025   Blood, UA neg    pH, UA 7.0 5.0 - 8.0   Protein, UA Negative Negative   Urobilinogen, UA 0.2 0.2 or 1.0 E.U./dL   Nitrite, UA neg    Leukocytes, UA Negative Negative   Appearance yellow    Odor       Assessment:   Bacterial vaginosis infection  Dysuria  Plan:   -Suspected bacterial vaginosis infection based on symptoms.  Patient with only few clue cells on wet prep will call in prescription for Flagyl.  Also will send in a prescription to be held for Diflucan in case of yeast infection following antibiotic use. -Discussed home self-care with regards to current symptoms, and for decreasing risk of potential atrial infections. - Dysuria, mild but no evidence of UTI on today's exam.  Given reassurance, advised to continue increased hydration and use of cranberry juice/tablets for symptoms.  -RTC as needed.    Hildred Laser, MD [facility]

## 2018-11-29 DIAGNOSIS — K7689 Other specified diseases of liver: Secondary | ICD-10-CM | POA: Diagnosis not present

## 2018-12-12 ENCOUNTER — Encounter: Payer: Self-pay | Admitting: Obstetrics and Gynecology

## 2018-12-14 ENCOUNTER — Telehealth: Payer: Self-pay

## 2018-12-14 MED ORDER — DICLOXACILLIN SODIUM 500 MG PO CAPS: 500.0000 mg | ORAL_CAPSULE | Freq: Four times a day (QID) | ORAL | 0 refills | Status: DC

## 2018-12-14 NOTE — Telephone Encounter (Signed)
Possible mastitis.    Started last nite. Firm left breast. Sore breast. Splotchy red and warm to the touch. No fever. Pt is achy.   Pls advise.   [facility] in [place].

## 2018-12-14 NOTE — Telephone Encounter (Signed)
Spoke with pt about her mastitis pt stated that she was not sure if she was having a fever or not. Pt was advise to keep a check on her temp and to contact the office if she gets a fever or her symptoms increase. Pt stated that she understood. Medication was sent to her pharmacy.

## 2018-12-19 ENCOUNTER — Telehealth: Payer: Self-pay

## 2018-12-19 NOTE — Progress Notes (Signed)
Pt has a follow up for possible mastitis. Pt was given Dicloxacillin 500mg  last week on 12/14/18 to help treat mastitis. Pt stated that her breast is doing a lot better but she still feels some sharp pain in the breast not as much as before. Pt stated that the redness and other issues has decreased.

## 2018-12-19 NOTE — Telephone Encounter (Signed)
Pt prescreened no symptoms. Pt has face mask.   Coronavirus (COVID-19) Are you at risk?  Are you at risk for the Coronavirus (COVID-19)?  To be considered HIGH RISK for Coronavirus (COVID-19), you have to meet the following criteria:  . Traveled to [place], [place], [place], [place] or [place]; or in the [place] to [place], [place], [place], or [place]; and have fever, cough, and shortness of breath within the last 2 weeks of travel OR . Been in close contact with a person diagnosed with COVID-19 within the last 2 weeks and have fever, cough, and shortness of breath . IF YOU DO NOT MEET THESE CRITERIA, YOU ARE CONSIDERED LOW RISK FOR COVID-19.  What to do if you are HIGH RISK for COVID-19?  . If you are having a medical emergency, call 911. . Seek medical care right away. Before you go to a doctor's office, urgent care or emergency department, call ahead and tell them about your recent travel, contact with someone diagnosed with COVID-19, and your symptoms. You should receive instructions from your physician's office regarding next steps of care.  . When you arrive at healthcare provider, tell the healthcare staff immediately you have returned from visiting [place], [place], [place], [place] or [place]; or traveled in the [place] to [place], [place], [place], or [place]; in the last two weeks or you have been in close contact with a person diagnosed with COVID-19 in the last 2 weeks.   . Tell the health care staff about your symptoms: fever, cough and shortness of breath. . After you have been seen by a medical provider, you will be either: o Tested for (COVID-19) and discharged home on quarantine except to seek medical care if symptoms worsen, and asked to  - Stay home and avoid contact with others until you get your results (4-5 days)  - Avoid travel on public transportation if possible (such as bus, train, or airplane) or o Sent to the Emergency Department by EMS for  evaluation, COVID-19 testing, and possible admission depending on your condition and test results.  What to do if you are LOW RISK for COVID-19?  Reduce your risk of any infection by using the same precautions used for avoiding the common cold or flu:  . Wash your hands often with soap and warm water for at least 20 seconds.  If soap and water are not readily available, use an alcohol-based hand sanitizer with at least 60% alcohol.  . If coughing or sneezing, cover your mouth and nose by coughing or sneezing into the elbow areas of your shirt or coat, into a tissue or into your sleeve (not your hands). . Avoid shaking hands with others and consider head nods or verbal greetings only. . Avoid touching your eyes, nose, or mouth with unwashed hands.  . Avoid close contact with people who are sick. . Avoid places or events with large numbers of people in one location, like concerts or sporting events. . Carefully consider travel plans you have or are making. . If you are planning any travel outside or inside the [place], visit the CDC's Travelers' Health webpage for the latest health notices. . If you have some symptoms but not all symptoms, continue to monitor at home and seek medical attention if your symptoms worsen. . If you are having a medical emergency, call 911.   ADDITIONAL HEALTHCARE OPTIONS FOR PATIENTS  [facility] / e-Visit: [email or web address]           [facility]: [phone]  [facility]: [phone]                   [facility]: [phone]  

## 2018-12-20 ENCOUNTER — Encounter: Payer: Self-pay | Admitting: Obstetrics and Gynecology

## 2018-12-20 ENCOUNTER — Ambulatory Visit: Payer: BC Managed Care – PPO | Admitting: Obstetrics and Gynecology

## 2018-12-20 ENCOUNTER — Other Ambulatory Visit: Payer: Self-pay

## 2018-12-20 VITALS — BP 123/78 | HR 94 | Ht 63.0 in | Wt 146.0 lb

## 2018-12-20 DIAGNOSIS — O9123 Nonpurulent mastitis associated with lactation: Secondary | ICD-10-CM | POA: Diagnosis not present

## 2018-12-20 NOTE — Progress Notes (Signed)
    GYNECOLOGY PROGRESS NOTE  Subjective:    Patient ID: Maria Drake, female    DOB: 1981/06/02, 38 y.o.   MRN: [id number]  HPI  Patient is a 38 y.o. G1P0 female who presents for f/u of mastitis.  She is 6 months postpartum, currently breastfeeding. Patient initially noted breast tenderness and redness on the left, followed several days later on the right. Currently on Day#6 of antibiotic (dicloxacillin). Still denies fevers, but does note cold and hot spells. Breasts are not as tender, however does note some mild shooting pains. Notes redness has improved.   The following portions of the patient's history were reviewed and updated as appropriate: allergies, current medications, past family history, past medical history, past social history, past surgical history and problem list.  Review of Systems Pertinent items noted in HPI and remainder of comprehensive ROS otherwise negative.   Objective:   Blood pressure 123/78, pulse 94, height 5\' 3"  (1.6 m), weight 146 lb (66.2 kg), currently breastfeeding. General appearance: alert and no distress Breasts: lactating, no erythema or tenderness, nipples normal, no skin changes.  Assessment:   Mastitis due to lactation  Plan:   1. Continue on Dicloxacillin for total of 10 days.  2. To follow up as needed.    Rubie Maid, MD [facility]

## 2018-12-20 NOTE — Patient Instructions (Signed)
Breastfeeding and Human Lactation (4th ed., pp. 262-299). [place], [place]: [employer].">   Breastfeeding and Mastitis    Mastitis is inflammation of the breast tissue. It can occur in women who are breastfeeding. This can make breastfeeding painful. Mastitis will sometimes go away on its own, especially if it is not caused by an infection (non-infectious mastitis). Your health care provider will help determine if medical treatment is needed. Treatment may be needed if the condition is caused by a bacterial infection (infectious mastitis).  What are the causes?  This condition is often associated with a blocked milkduct, which can happen when too much milk builds up in the breast. Causes of excess milk in the breast can include:  Poor latch-on. If your baby is not latched onto the breast properly, he or she may not empty your breast completely while breastfeeding.  Allowing too much time to pass between feedings.  Wearing a bra or other clothing that is too tight. This puts extra pressure on the milk ducts so milk does not flow through them as it should.  Milk remaining in the breast because it is overfilled (engorged).  Stress and fatigue.  Mastitis can also be caused by a bacterial infection. Bacteria may enter the breast tissue through cuts, cracks, or openings in the skin near the nipple area. Cracks in the skin are often caused when your baby does not latch on properly to the breast.  What are the signs or symptoms?  Symptoms of this condition include:  Swelling, redness, tenderness, and pain in an area of the breast. This usually affects the upper part of the breast, toward the armpit region. In most cases, it affects only one breast. In some cases, it may occur on both breasts at the same time and affect a larger portion of breast tissue.  Swelling of the glands under the arm on the same side.  Fatigue, headache, and flu-like muscle aches.  Fever.  Rapid pulse.  Symptoms usually last 2 to 5  days. Breast pain and redness are at their worst on day 2 and day 3, and they usually go away by day 5. If an infection is left to progress, a collection of pus (abscess) may develop.  How is this diagnosed?  This condition can be diagnosed based on your symptoms and a physical exam. You may also have tests, such as:  Blood tests to determine if your body is fighting a bacterial infection.  Mammogram or ultrasound tests to rule out other problems or diseases.  Fluid tests. If an abscess has developed, the fluid in the abscess may be removed with a needle. The fluid may be analyzed to determine if bacteria are present.  Breast milk may be cultured and tested for bacteria.  How is this treated?  This condition will sometimes go away on its own. Your health care provider may choose to wait 24 hours after first seeing you to decide whether treatment is needed. If treatment is needed, it may include:  Strategies to manage breastfeeding. This includes continuing to breastfeed or pump in order to allow adequate milk flow, using breast massage, and applying heat or cold to the affected area.  Self-care such as rest and increased fluid intake.  Medicine for pain.  Antibiotic medicine to treat a bacterial infection. This is usually taken by mouth.  If an abscess has developed, it may be treated by removing fluid with a needle.  Follow these instructions at home:  Medicines  Take   over-the-counter and prescription medicines only as told by your health care provider.  If you were prescribed an antibiotic medicine, take it as told by your health care provider. Do not stop taking the antibiotic even if you start to feel better.  General instructions  Do not wear a tight or underwire bra. Wear a soft, supportive bra.  Increase your fluid intake, especially if you have a fever.  Get plenty of rest.  For breastfeeding:  Continue to empty your breasts as often as possible, either by breastfeeding or using an electric breast pump. This  will lower the pressure and the pain that comes with it. Ask your health care provider if changes need to be made to your breastfeeding or pumping routine.  Keep your nipples clean and dry.  During breastfeeding, empty the first breast completely before going to the other breast. If your baby is not emptying your breasts completely, use a breast pump to empty your breasts.  Use breast massage during feeding or pumping sessions.  If directed, apply moist heat to the affected area of your breast right before breastfeeding or pumping. Use the heat source that your health care provider recommends.  If directed, put ice on the affected area of your breast right after breastfeeding or pumping:  Put ice in a plastic bag.  Place a towel between your skin and the bag.  Leave the ice on for 20 minutes.  If you go back to work, pump your breasts while at work to stay in time with your nursing schedule.  Do not allow your breasts to become engorged.  Contact a health care provider if:  You have pus-like discharge from the breast.  You have a fever.  Your symptoms do not improve within 2 days of starting treatment.  Your symptoms return after you have recovered from a breast infection.  Get help right away if:  Your pain and swelling are getting worse.  You have pain that is not controlled with medicine.  You have a red line extending from the breast toward your armpit.  Summary  Mastitis is inflammation of the breast tissue. It is often caused by a blocked milk duct or bacteria.  This condition may be treated with hot and cold compresses, medicines, self-care, and certain breastfeeding strategies.  If you were prescribed an antibiotic medicine, take it as told by your health care provider. Do not stop taking the antibiotic even if you start to feel better.  Continue to empty your breasts as often as possible either by breastfeeding or using an electric breast pump.  This information is not intended to replace advice given to  you by your health care provider. Make sure you discuss any questions you have with your health care provider.  Document Released: 10/23/2004 Document Revised: 03/17/2018 Document Reviewed: 06/29/2016  Elsevier Interactive Patient Education © 2019 [employer].

## 2019-02-14 ENCOUNTER — Encounter: Payer: Self-pay | Admitting: Obstetrics and Gynecology

## 2019-02-14 ENCOUNTER — Ambulatory Visit (INDEPENDENT_AMBULATORY_CARE_PROVIDER_SITE_OTHER): Payer: BC Managed Care – PPO | Admitting: Obstetrics and Gynecology

## 2019-02-14 ENCOUNTER — Other Ambulatory Visit: Payer: Self-pay

## 2019-02-14 VITALS — BP 116/73 | HR 85 | Ht 63.0 in | Wt 146.4 lb

## 2019-02-14 DIAGNOSIS — N912 Amenorrhea, unspecified: Secondary | ICD-10-CM | POA: Diagnosis not present

## 2019-02-14 DIAGNOSIS — Z3009 Encounter for other general counseling and advice on contraception: Secondary | ICD-10-CM | POA: Diagnosis not present

## 2019-02-14 DIAGNOSIS — Z3169 Encounter for other general counseling and advice on procreation: Secondary | ICD-10-CM | POA: Diagnosis not present

## 2019-02-14 NOTE — Progress Notes (Signed)
Pt is present today for a follow up requested by [facility]. Pt stated that her thyroid and glucose levels were elevated during her pregnancy. Pt would like to continue taking the birth control Micronor.

## 2019-02-14 NOTE — Progress Notes (Signed)
GYNECOLOGY PROGRESS NOTE  Subjective:    Patient ID: Maria Drake, female    DOB: 31-Dec-1980, 38 y.o.   MRN: [id number]  HPI  Patient is a 38 y.o. G1P0 female who presents for discussion of the following concerns:    1. Desires to discuss weaning of infant from breastfeeding.  Plans to go for 1 full year of breastfeeding (to go until September), and then desires to wean down to just breastfeeding at night as her infant is now taking in more solid foods.  2. Desires to discuss contraception after breastfeeding. Currently on minipill. Notes that she does not necessarily desire to change contraceptive methods right now, but wonders if she will need to after she discontinues breastfeeding.  Note her cycles in the past usually have not been heavy, but did deal with some acne.  3. Desires to discuss fertility issues and when she desires to become pregnant again. Had fertility issues in the past, required follow up with an infertility specialist. Wonders if she will require this again.  Also had issues with the pregnancy (diabetes (diet controlled), subclinical thyroid disease), infant born with ventriculomegaly. Also curious if she will have to have another C-section.  4. Has concerns about her menstrual cycle.  Has not had a cycle since before her pregnancy.  Notes she has begun to notice a mucus discharge over the past month or so, also feeling some cramping occasionally but never notices any bleeding.   The following portions of the patient's history were reviewed and updated as appropriate: allergies, current medications, past family history, past medical history, past social history, past surgical history and problem list.  Review of Systems Pertinent items noted in HPI and remainder of comprehensive ROS otherwise negative.   Objective:   Blood pressure 116/73, pulse 85, height 5\' 3"  (1.6 m), weight 146 lb 6.4 oz (66.4 kg), currently breastfeeding. General appearance: alert and no  distress Exam deferred today.    Assessment:   1. Encounter for preconception consultation   2. Lactational amenorrhea   3. Encounter for counseling regarding contraception   4. Lactating mother     Plan:   1. Addressed patient's many concerns regarding future fertility and possible complications in pregnancy.  Advised that she may or may not require assisted fertility with her next pregnancy. Discussed that if she does, we would manage accordingly with thyroid medication, ovulation induction, and referral if needed.  Patient also reassured that ventriculomegaly is usually isolated, and does not occur in subsequent pregnancies unless there is an underlying disorder. Patient reports that she and her baby underwent numerous genetic tests, and all were normal. Discussed with patient that due to her C-section being for fetal indications, she was allowed the choice of repeat C-section vs TOLAC. Due to her history of diabetes and subclinical thyroid disease, these would be screened for early in pregnancy and managed if present.  2.  Amenorrhea - may be secondary to lactaion as well as her use of the mini pill. Discussed that as she continues to wean, that her periods may return (although is still possible to be amenorrheic on the mini pill). Given reassurance that this is normal.  3. Discussion options for contraception. Patient likes the mini pill, wonders if she will have to come off the medication. Patient advised that as long as she is ok with continuing the mini pill, she does not have to change, even if she discontinues breastfeeding.  Can discuss options for acne if this becomes  a problem.  4. Lactating, desiring to discuss weaning. Advised on techniques for weaning, methods of decreasing milk supply. Pumping when away from home and decreasing frequency of pumping/feeding.   Return to clinic for any scheduled appointments or for any gynecologic concerns as needed. To schedule for annual exam in  2-3 months.     A total of 30 minutes were spent face-to-face with the patient during this encounter and over half of that time dealt with counseling and coordination of care.   Hildred Laserherry, Maria Pecor, MD [facility]

## 2019-02-14 NOTE — Patient Instructions (Signed)
Weaning  Weaning is the process of stopping breastfeeding. This may be a natural process that takes place on its own over time, or there may be a reason you need to stop breastfeeding. If possible, wait until your baby is at least 72 months old before weaning. With a little time and preparation, weaning can be a positive experience. When is the best time to stop breastfeeding? There is no right or wrong time. What is best for you and your child may be different from what is best for other mothers and their children. It is recommended that you:  Feed your baby only breast milk for the first 6 months.  Start to introduce solid foods when your baby is around 25 months old. Feed your baby both breast milk and solid food for another 6 months.  Continue to breastfeed your baby as long as both you and your child want to after 12 months. Your baby may start to wean himself or herself at about 6 months. This may happen when:  Your baby starts to eat more solid food. Your baby may still prefer to nurse in order to get fluids.  Your baby is able to drink from a cup. This may prompt your baby to breastfeed less often.  Your baby gradually becomes less interested in breastfeeding as he or she gets used to drinking other fluids.  Your baby slowly starts to drop one breastfeeding session every 2-3 days. Sometimes, becoming pregnant again can signal a time to wean. During pregnancy, your milk supply naturally decreases, and your milk may start to have a less desirable taste. How do I start weaning? Wean gradually over several weeks. Some guidelines to follow are:  Encourage your baby to try different feeding methods, such as: ? Drinking pre-pumped (expressed) breast milk out of a cup. ? Drinking from a bottle, if he or she will not use a cup. ? Having some else offer the first cup or bottle feeding. This will make it less confusing for your baby.  When cup or bottle feeding is successful, and your baby is  getting enough nutrition that way, you can substitute a cup feeding for one breastfeeding session a day.  Over several weeks, replace one more breastfeeding session with cup feeding every few days. This may be easier if your baby will drink something from the cup besides your expressed breast milk.  If you are returning to work, start by replacing normal feedings during work hours with cup or bottle feedings.  If your child is around 2 months old, start giving your child small amounts of one-ingredient nutrient-rich solid food in addition to breast milk. Common first foods include: ? Pureed or mashed banana. ? Unsweetened apple sauce. ? Pureed or mashed avocado. ? Cooked and mashed sweet potato. ? Infant rice cereal that has added iron (iron-fortified).  Pay attention to your child's behavior and emotional needs when weaning. Consider replacing breastfeeding with a soothing activity, such as story time. Providing your baby with a routine can help them feel secure. Your breasts may feel full and uncomfortable at times during weaning. It may help to express a small amount of breast milk to relieve some tension, but do not completely empty your breasts. Where to find support Your health care provider or a lactation consultant can help you during the weaning process. They can help you decide which foods are best to introduce first and which fluids you can start substituting for breast milk. They can also help if  you have any problems related to breastfeeding or weaning. Contact a health care provider if:  One or both breasts become firm, painful, or red.  Your baby suddenly stops nursing (nursing strike).  Your baby is not gaining weight.  You develop a fever. Summary  Weaning is the process of stopping breastfeeding. This may be a natural process that takes place on its own over time, or there may be a reason you need to stop breastfeeding.  The best time for you and your baby to wean may  be different from what is best for other mothers and their children.  Your baby may start to wean himself or herself at about 6 months, when they start eating more solid food or drinking from a cup.  Your health care provider or a lactation consultant can help you if you have any problems related to breastfeeding or weaning. This information is not intended to replace advice given to you by your health care provider. Make sure you discuss any questions you have with your health care provider. Document Released: 10/27/2004 Document Revised: 10/06/2016 Document Reviewed: 10/06/2016 Elsevier Patient Education  2020 [employer].

## 2019-04-16 ENCOUNTER — Other Ambulatory Visit: Payer: Self-pay

## 2019-04-16 MED ORDER — NORETHINDRONE 0.35 MG PO TABS: 1.0000 | ORAL_TABLET | Freq: Every day | ORAL | 1 refills | Status: DC

## 2019-04-24 DIAGNOSIS — D2272 Melanocytic nevi of left lower limb, including hip: Secondary | ICD-10-CM | POA: Diagnosis not present

## 2019-04-24 DIAGNOSIS — D485 Neoplasm of uncertain behavior of skin: Secondary | ICD-10-CM | POA: Diagnosis not present

## 2019-04-24 DIAGNOSIS — R208 Other disturbances of skin sensation: Secondary | ICD-10-CM | POA: Diagnosis not present

## 2019-04-24 DIAGNOSIS — D225 Melanocytic nevi of trunk: Secondary | ICD-10-CM | POA: Diagnosis not present

## 2019-04-24 DIAGNOSIS — D1801 Hemangioma of skin and subcutaneous tissue: Secondary | ICD-10-CM | POA: Diagnosis not present

## 2019-04-24 DIAGNOSIS — D2262 Melanocytic nevi of left upper limb, including shoulder: Secondary | ICD-10-CM | POA: Diagnosis not present

## 2019-04-24 DIAGNOSIS — D2261 Melanocytic nevi of right upper limb, including shoulder: Secondary | ICD-10-CM | POA: Diagnosis not present

## 2019-05-07 ENCOUNTER — Ambulatory Visit (INDEPENDENT_AMBULATORY_CARE_PROVIDER_SITE_OTHER): Payer: BC Managed Care – PPO | Admitting: Obstetrics and Gynecology

## 2019-05-07 ENCOUNTER — Other Ambulatory Visit: Payer: Self-pay

## 2019-05-07 VITALS — BP 126/76 | HR 86 | Ht 63.0 in | Wt 149.9 lb

## 2019-05-07 DIAGNOSIS — Z23 Encounter for immunization: Secondary | ICD-10-CM | POA: Diagnosis not present

## 2019-05-07 NOTE — Progress Notes (Signed)
Pt is present today for flu vaccine injection.   BP 126/76   Pulse 86   Wt 149 lb 14.4 oz (68 kg)   BMI 26.55 kg/m

## 2019-05-07 NOTE — Patient Instructions (Signed)

## 2019-05-23 ENCOUNTER — Encounter: Payer: BC Managed Care – PPO | Admitting: Obstetrics and Gynecology

## 2019-05-23 ENCOUNTER — Encounter: Payer: Self-pay | Admitting: Obstetrics and Gynecology

## 2019-06-11 ENCOUNTER — Telehealth: Payer: Self-pay | Admitting: Obstetrics and Gynecology

## 2019-06-13 ENCOUNTER — Encounter: Payer: BC Managed Care – PPO | Admitting: Obstetrics and Gynecology

## 2019-06-25 NOTE — Telephone Encounter (Signed)
na

## 2019-08-01 ENCOUNTER — Ambulatory Visit: Payer: BC Managed Care – PPO | Attending: Internal Medicine

## 2019-08-01 DIAGNOSIS — Z20822 Contact with and (suspected) exposure to covid-19: Secondary | ICD-10-CM | POA: Insufficient documentation

## 2019-08-02 LAB — NOVEL CORONAVIRUS, NAA: SARS-CoV-2, NAA: NOT DETECTED

## 2019-08-06 ENCOUNTER — Telehealth: Payer: Self-pay | Admitting: Obstetrics and Gynecology

## 2019-08-06 ENCOUNTER — Ambulatory Visit: Payer: BC Managed Care – PPO | Attending: Internal Medicine

## 2019-08-06 DIAGNOSIS — U071 COVID-19: Secondary | ICD-10-CM | POA: Insufficient documentation

## 2019-08-06 DIAGNOSIS — Z20822 Contact with and (suspected) exposure to covid-19: Secondary | ICD-10-CM

## 2019-08-06 NOTE — Telephone Encounter (Signed)
Pt has symptoms of covid and the pt is breast feeding. The pt doesn't what to take anything that will harm the baby. Pt is requesting a call back .please advise

## 2019-08-06 NOTE — Telephone Encounter (Signed)
Pt called and went over list of medication safe to take. Pt also informed that a list of medication safe to take in pregnancy was sent to her via mychart.

## 2019-08-07 LAB — NOVEL CORONAVIRUS, NAA: SARS-CoV-2, NAA: DETECTED — AB

## 2019-08-08 DIAGNOSIS — U071 COVID-19: Secondary | ICD-10-CM | POA: Diagnosis not present

## 2019-08-08 DIAGNOSIS — J329 Chronic sinusitis, unspecified: Secondary | ICD-10-CM | POA: Diagnosis not present

## 2019-08-08 DIAGNOSIS — B9689 Other specified bacterial agents as the cause of diseases classified elsewhere: Secondary | ICD-10-CM | POA: Diagnosis not present

## 2019-09-20 ENCOUNTER — Encounter: Payer: BC Managed Care – PPO | Admitting: Obstetrics and Gynecology

## 2019-10-15 ENCOUNTER — Other Ambulatory Visit: Payer: Self-pay | Admitting: Obstetrics and Gynecology

## 2019-10-15 NOTE — Telephone Encounter (Signed)
Patient can receive further refills at appointment 11/06/2019.

## 2019-11-06 ENCOUNTER — Ambulatory Visit (INDEPENDENT_AMBULATORY_CARE_PROVIDER_SITE_OTHER): Payer: BC Managed Care – PPO | Admitting: Obstetrics and Gynecology

## 2019-11-06 ENCOUNTER — Encounter: Payer: Self-pay | Admitting: Obstetrics and Gynecology

## 2019-11-06 ENCOUNTER — Other Ambulatory Visit: Payer: Self-pay

## 2019-11-06 VITALS — BP 134/82 | HR 90 | Ht 67.0 in | Wt 158.0 lb

## 2019-11-06 DIAGNOSIS — Z8632 Personal history of gestational diabetes: Secondary | ICD-10-CM

## 2019-11-06 DIAGNOSIS — Z8616 Personal history of COVID-19: Secondary | ICD-10-CM

## 2019-11-06 DIAGNOSIS — Z01419 Encounter for gynecological examination (general) (routine) without abnormal findings: Secondary | ICD-10-CM | POA: Diagnosis not present

## 2019-11-06 DIAGNOSIS — E282 Polycystic ovarian syndrome: Secondary | ICD-10-CM

## 2019-11-06 DIAGNOSIS — Z8759 Personal history of other complications of pregnancy, childbirth and the puerperium: Secondary | ICD-10-CM

## 2019-11-06 DIAGNOSIS — E039 Hypothyroidism, unspecified: Secondary | ICD-10-CM | POA: Diagnosis not present

## 2019-11-06 DIAGNOSIS — Z30011 Encounter for initial prescription of contraceptive pills: Secondary | ICD-10-CM

## 2019-11-06 DIAGNOSIS — Z3169 Encounter for other general counseling and advice on procreation: Secondary | ICD-10-CM

## 2019-11-06 DIAGNOSIS — Z8279 Family history of other congenital malformations, deformations and chromosomal abnormalities: Secondary | ICD-10-CM

## 2019-11-06 NOTE — Progress Notes (Signed)
GYNECOLOGY ANNUAL PHYSICAL EXAM PROGRESS NOTE  Subjective:    Maria Drake is a 39 y.o. G1P0 female who presents for an annual exam. The patient is sexually active. The patient wears seatbelts: yes. The patient participates in regular exercise: no. Has the patient ever been transfused or tattooed?: no.   The patient has the following concerns today:  1. Breon notes that she has a PMH significant for PCOS and endometriosis. Notes that she has stopped breastfeeding mid-March. Notes that her she and her husband are planning to conceive again soon.  Reports that they had to utilize IUI for conception (reports husband had some mildly abnormal morphology, treated with vitamins; she also notes her cycles were not regular) .  Wonders if she has to go through this again. Notes recent cycle was normal after discontinuing breastfeeding.  2. Patient desires to discuss options for birth control until they decide they are ready to conceive.  Wants something that is easy to start or stop.  3. Has questions regarding COVID and the vaccine in pregnancy. She reports a personal history of COVID in January.    Gynecologic History  Patient's last menstrual period was 10/13/2019. Contraception: none.  History of STI's:  Last Pap: 09/27/2017. Results were: normal.  Denies h/o abnormal pap smears.   OB History  Gravida Para Term Preterm AB Living  1 1 1  0 0 1  SAB TAB Ectopic Multiple Live Births  0 0 0 0 1    # Outcome Date GA Lbr Len/2nd Weight Sex Delivery Anes PTL Lv  1 Term 03/27/18 [redacted]w[redacted]d  8 lb 1.6 oz (3.675 kg) M CS-LTranv Spinal N LIV     Birth Comments: fetal hydrocephalus     Complications: Hydrocephalus, Gestational hypertension, Gestational diabetes     Apgar1: 9  Apgar5: 9    Past Medical History:  Diagnosis Date  . Endometrial disorder   . Endometriosis   . Environmental allergies   . Gestational diabetes   . Gestational hypertension   . Headache   . Hyperthyroidism   . PCOS  (polycystic ovarian syndrome)   . Tachycardia     Past Surgical History:  Procedure Laterality Date  . CESAREAN SECTION    . LAPAROSCOPIC ENDOMETRIOSIS FULGURATION    . ROBOTIC ASSISTED LAPAROSCOPIC CHOLECYSTECTOMY-MULTI SITE N/A 06/22/2018   Procedure: ROBOTIC ASSISTED LAPAROSCOPIC CHOLECYSTECTOMY-MULTI SITE;  Surgeon: 14/06/2018, MD;  Location: [facility] ORS;  Service: General;  Laterality: N/A;  . WISDOM TOOTH EXTRACTION      Family History  Problem Relation Age of Onset  . Diabetes Mother   . Heart disease Father   . Diabetes Paternal Uncle   . Diabetes Maternal Grandfather   . Diabetes Paternal Grandfather   . Heart disease Paternal Grandfather   . Breast cancer Neg Hx   . Ovarian cancer Neg Hx     Social History   Socioeconomic History  . Marital status: Married    Spouse name: Not on file  . Number of children: Not on file  . Years of education: Not on file  . Highest education level: Not on file  Occupational History  . Not on file  Tobacco Use  . Smoking status: Never Smoker  . Smokeless tobacco: Never Used  Substance and Sexual Activity  . Alcohol use: No  . Drug use: No  . Sexual activity: Yes    Birth control/protection: None, Pill, Condom  Other Topics Concern  . Not on file  Social History Narrative  .  Not on file   Social Determinants of Health   Financial Resource Strain:   . Difficulty of Paying Living Expenses:   Food Insecurity:   . Worried About Programme researcher, broadcasting/film/video in the Last Year:   . Barista in the Last Year:   Transportation Needs:   . Freight forwarder (Medical):   Marland Kitchen Lack of Transportation (Non-Medical):   Physical Activity:   . Days of Exercise per Week:   . Minutes of Exercise per Session:   Stress:   . Feeling of Stress :   Social Connections:   . Frequency of Communication with Friends and Family:   . Frequency of Social Gatherings with Friends and Family:   . Attends Religious Services:   . Active Member of  Clubs or Organizations:   . Attends Banker Meetings:   Marland Kitchen Marital Status:   Intimate Partner Violence:   . Fear of Current or Ex-Partner:   . Emotionally Abused:   Marland Kitchen Physically Abused:   . Sexually Abused:     Current Outpatient Medications on File Prior to Visit  Medication Sig Dispense Refill  . acetaminophen (TYLENOL) 500 MG tablet Take 500 mg by mouth every 6 (six) hours as needed for moderate pain.     . cetirizine (ZYRTEC) 10 MG tablet Take 10 mg by mouth daily as needed for allergies.     . fluticasone (FLONASE) 50 MCG/ACT nasal spray Place 2 sprays into the nose daily as needed for allergies.     Marland Kitchen ibuprofen (ADVIL,MOTRIN) 200 MG tablet Take 200 mg by mouth every 6 (six) hours as needed.    . NORLYDA 0.35 MG tablet TAKE 1 TABLET(0.35 MG) BY MOUTH DAILY 28 tablet 0  . polyethylene glycol (MIRALAX / GLYCOLAX) packet Take 17 g by mouth daily as needed.    . Prenatal Vit-Fe Fumarate-FA (PRENATAL MULTIVITAMIN) TABS tablet Take 1 tablet by mouth daily at 12 noon.    . Azelaic Acid (FINACEA) 15 % FOAM Apply 1 application topically daily as needed (acne break outs).     . dicloxacillin (DYNAPEN) 500 MG capsule Take 1 capsule (500 mg total) by mouth 4 (four) times daily. (Patient not taking: Reported on 02/14/2019) 40 capsule 0   No current facility-administered medications on file prior to visit.    Allergies  Allergen Reactions  . Decongest-Aid [Pseudoephedrine] Anaphylaxis    tachycardia  . Codeine Nausea And Vomiting     Review of Systems Constitutional: negative for chills, fatigue, fevers and sweats Eyes: negative for irritation, redness and visual disturbance Ears, nose, mouth, throat, and face: negative for hearing loss, nasal congestion, snoring and tinnitus Respiratory: negative for asthma, cough, sputum Cardiovascular: negative for chest pain, dyspnea, exertional chest pressure/discomfort, irregular heart beat, palpitations and syncope Gastrointestinal:  negative for abdominal pain, change in bowel habits, nausea and vomiting Genitourinary: negative for abnormal menstrual periods, genital lesions, sexual problems and vaginal discharge, dysuria and urinary incontinence Integument/breast: negative for breast lump, breast tenderness and nipple discharge Hematologic/lymphatic: negative for bleeding and easy bruising Musculoskeletal:negative for back pain and muscle weakness Neurological: negative for dizziness, headaches, vertigo and weakness Endocrine: negative for diabetic symptoms including polydipsia, polyuria and skin dryness Allergic/Immunologic: negative for hay fever and urticaria        Objective:  Blood pressure 134/82, pulse 90, height 5\' 7"  (1.702 m), weight 158 lb (71.7 kg), last menstrual period 10/13/2019, not currently breastfeeding. Body mass index is 24.75 kg/m.  General Appearance:  Alert, cooperative, no distress, appears stated age  Head:    Normocephalic, without obvious abnormality, atraumatic  Eyes:    PERRL, conjunctiva/corneas clear, EOM's intact, both eyes  Ears:    Normal external ear canals, both ears  Nose:   Nares normal, septum midline, mucosa normal, no drainage or sinus tenderness  Throat:   Lips, mucosa, and tongue normal; teeth and gums normal  Neck:   Supple, symmetrical, trachea midline, no adenopathy; thyroid: no enlargement/tenderness/nodules; no carotid bruit or JVD  Back:     Symmetric, no curvature, ROM normal, no CVA tenderness  Lungs:     Clear to auscultation bilaterally, respirations unlabored  Chest Wall:    No tenderness or deformity   Heart:    Regular rate and rhythm, S1 and S2 normal, no murmur, rub or gallop  Breast Exam:    No tenderness, masses, or nipple abnormality  Abdomen:     Soft, non-tender, bowel sounds active all four quadrants, no masses, no organomegaly.    Genitalia:    Pelvic:external genitalia normal, vagina without lesions, discharge, or tenderness, rectovaginal septum   normal. Cervix normal in appearance, no cervical motion tenderness, no adnexal masses or tenderness.  Uterus normal size, shape, mobile, regular contours, nontender.  Rectal:    Normal external sphincter.  No hemorrhoids appreciated. Internal exam not done.   Extremities:   Extremities normal, atraumatic, no cyanosis or edema  Pulses:   2+ and symmetric all extremities  Skin:   Skin color, texture, turgor normal, no rashes or lesions  Lymph nodes:   Cervical, supraclavicular, and axillary nodes normal  Neurologic:   CNII-XII intact, normal strength, sensation and reflexes throughout   .  Labs:  Lab Results  Component Value Date   WBC 9.6 09/12/2017   HGB 13.4 09/12/2017   HCT 39.3 09/12/2017   MCV 88 09/12/2017   PLT 283 09/12/2017    Lab Results  Component Value Date   CREATININE 0.75 12/29/2014   BUN 7 12/29/2014   NA 138 12/29/2014   K 3.9 12/29/2014   CL 104 12/29/2014   CO2 26 12/29/2014    No results found for: ALT, AST, GGT, ALKPHOS, BILITOT  Lab Results  Component Value Date   TSH 0.317 (L) 09/27/2017     Assessment:   . 1. Encounter for well woman exam with routine gynecological exam   2. PCOS (polycystic ovarian syndrome)   3. Hypothyroidism, unspecified type   4. Encounter for preconception consultation   5. Encounter for initial prescription of contraceptive pills   6. History of gestational diabetes   7. Personal history of covid-19   8. History of gestational hypertension   9. Family history of congenital hydrocephalus     Plan:    - Blood tests: CBC with diff, Comprehensive metabolic panel, Lipoproteins, TSH and HgbA1c. - Breast self exam technique reviewed and patient encouraged to perform self-exam monthly. - Contraception: Discussed option of being on birth control for the next several months to keep cycles regulated and prevent pregnancy for the next few months until truly ready to conceive. Patient ok to start an OCP. Will give  Lo-Loestrin. - Discussed healthy lifestyle modifications. - Hypothyroidism managed by PCP.  - History of GDM, GHTN, and hydrocephalus in last pregnancy with delivery via C-section. Also now AMA status. Discussion had on pre-conception counseling. Also discussed that if patient's cycles are regular, may not require IUI. Would encourage him to resume the fertility vitamins. Also could utilize Femara again (used  for last pregnancy). To begin PNV when actively conceiving.  - Answered patient's questions about COVID in pregnancy.   - Follow up in 1 year or sooner as needed.    Rubie Maid, MD [facility]

## 2019-11-06 NOTE — Patient Instructions (Addendum)
Preventive Care 21-37 Years Old, Female Preventive care refers to visits with your health care provider and lifestyle choices that can promote health and wellness. This includes:  A yearly physical exam. This may also be called an annual well check.  Regular dental visits and eye exams.  Immunizations.  Screening for certain conditions.  Healthy lifestyle choices, such as eating a healthy diet, getting regular exercise, not using drugs or products that contain nicotine and tobacco, and limiting alcohol use. What can I expect for my preventive care visit? Physical exam Your health care provider will check your:  Height and weight. This may be used to calculate body mass index (BMI), which tells if you are at a healthy weight.  Heart rate and blood pressure.  Skin for abnormal spots. Counseling Your health care provider may ask you questions about your:  Alcohol, tobacco, and drug use.  Emotional well-being.  Home and relationship well-being.  Sexual activity.  Eating habits.  Work and work environment.  Method of birth control.  Menstrual cycle.  Pregnancy history. What immunizations do I need?  Influenza (flu) vaccine  This is recommended every year. Tetanus, diphtheria, and pertussis (Tdap) vaccine  You may need a Td booster every 10 years. Varicella (chickenpox) vaccine  You may need this if you have not been vaccinated. Human papillomavirus (HPV) vaccine  If recommended by your health care provider, you may need three doses over 6 months. Measles, mumps, and rubella (MMR) vaccine  You may need at least one dose of MMR. You may also need a second dose. Meningococcal conjugate (MenACWY) vaccine  One dose is recommended if you are age 19-21 years and a first-year college student living in a residence hall, or if you have one of several medical conditions. You may also need additional booster doses. Pneumococcal conjugate (PCV13) vaccine  You may need  this if you have certain conditions and were not previously vaccinated. Pneumococcal polysaccharide (PPSV23) vaccine  You may need one or two doses if you smoke cigarettes or if you have certain conditions. Hepatitis A vaccine  You may need this if you have certain conditions or if you travel or work in places where you may be exposed to hepatitis A. Hepatitis B vaccine  You may need this if you have certain conditions or if you travel or work in places where you may be exposed to hepatitis B. Haemophilus influenzae type b (Hib) vaccine  You may need this if you have certain conditions. You may receive vaccines as individual doses or as more than one vaccine together in one shot (combination vaccines). Talk with your health care provider about the risks and benefits of combination vaccines. What tests do I need?  Blood tests  Lipid and cholesterol levels. These may be checked every 5 years starting at age 20.  Hepatitis C test.  Hepatitis B test. Screening  Diabetes screening. This is done by checking your blood sugar (glucose) after you have not eaten for a while (fasting).  Sexually transmitted disease (STD) testing.  BRCA-related cancer screening. This may be done if you have a family history of breast, ovarian, tubal, or peritoneal cancers.  Pelvic exam and Pap test. This may be done every 3 years starting at age 21. Starting at age 30, this may be done every 5 years if you have a Pap test in combination with an HPV test. Talk with your health care provider about your test results, treatment options, and if necessary, the need for more tests.   Follow these instructions at home: Eating and drinking   Eat a diet that includes fresh fruits and vegetables, whole grains, lean protein, and low-fat dairy.  Take vitamin and mineral supplements as recommended by your health care provider.  Do not drink alcohol if: ? Your health care provider tells you not to drink. ? You are  pregnant, may be pregnant, or are planning to become pregnant.  If you drink alcohol: ? Limit how much you have to 0-1 drink a day. ? Be aware of how much alcohol is in your drink. In the [place] one drink equals one 12 oz bottle of beer (355 mL), one 5 oz glass of wine (148 mL), or one 1 oz glass of hard liquor (44 mL). Lifestyle  Take daily care of your teeth and gums.  Stay active. Exercise for at least 30 minutes on 5 or more days each week.  Do not use any products that contain nicotine or tobacco, such as cigarettes, e-cigarettes, and chewing tobacco. If you need help quitting, ask your health care provider.  If you are sexually active, practice safe sex. Use a condom or other form of birth control (contraception) in order to prevent pregnancy and STIs (sexually transmitted infections). If you plan to become pregnant, see your health care provider for a preconception visit. What's next?  Visit your health care provider once a year for a well check visit.  Ask your health care provider how often you should have your eyes and teeth checked.  Stay up to date on all vaccines. This information is not intended to replace advice given to you by your health care provider. Make sure you discuss any questions you have with your health care provider. Document Revised: 03/09/2018 Document Reviewed: 03/09/2018 Elsevier Patient Education  2020 Elsevier Inc. Breast Self-Awareness Breast self-awareness is knowing how your breasts look and feel. Doing breast self-awareness is important. It allows you to catch a breast problem early while it is still small and can be treated. All women should do breast self-awareness, including women who have had breast implants. Tell your doctor if you notice a change in your breasts. What you need:  A mirror.  A well-lit room. How to do a breast self-exam A breast self-exam is one way to learn what is normal for your breasts and to check for changes. To do a  breast self-exam: Look for changes  1. Take off all the clothes above your waist. 2. Stand in front of a mirror in a room with good lighting. 3. Put your hands on your hips. 4. Push your hands down. 5. Look at your breasts and nipples in the mirror to see if one breast or nipple looks different from the other. Check to see if: ? The shape of one breast is different. ? The size of one breast is different. ? There are wrinkles, dips, and bumps in one breast and not the other. 6. Look at each breast for changes in the skin, such as: ? Redness. ? Scaly areas. 7. Look for changes in your nipples, such as: ? Liquid around the nipples. ? Bleeding. ? Dimpling. ? Redness. ? A change in where the nipples are. Feel for changes  1. Lie on your back on the floor. 2. Feel each breast. To do this, follow these steps: ? Pick a breast to feel. ? Put the arm closest to that breast above your head. ? Use your other arm to feel the nipple area of your breast. Feel   the area with the pads of your three middle fingers by making small circles with your fingers. For the first circle, press lightly. For the second circle, press harder. For the third circle, press even harder. ? Keep making circles with your fingers at the different pressures as you move down your breast. Stop when you feel your ribs. ? Move your fingers a little toward the center of your body. ? Start making circles with your fingers again, this time going up until you reach your collarbone. ? Keep making up-and-down circles until you reach your armpit. Remember to keep using the three pressures. ? Feel the other breast in the same way. 3. Sit or stand in the tub or shower. 4. With soapy water on your skin, feel each breast the same way you did in step 2 when you were lying on the floor. Write down what you find Writing down what you find can help you remember what to tell your doctor. Write down:  What is normal for each breast.  Any  changes you find in each breast, including: ? The kind of changes you find. ? Whether you have pain. ? Size and location of any lumps.  When you last had your menstrual period. General tips  Check your breasts every month.  If you are breastfeeding, the best time to check your breasts is after you feed your baby or after you use a breast pump.  If you get menstrual periods, the best time to check your breasts is 5-7 days after your menstrual period is over.  With time, you will become comfortable with the self-exam, and you will begin to know if there are changes in your breasts. Contact a doctor if you:  See a change in the shape or size of your breasts or nipples.  See a change in the skin of your breast or nipples, such as red or scaly skin.  Have fluid coming from your nipples that is not normal.  Find a lump or thick area that was not there before.  Have pain in your breasts.  Have any concerns about your breast health. Summary  Breast self-awareness includes looking for changes in your breasts, as well as feeling for changes within your breasts.  Breast self-awareness should be done in front of a mirror in a well-lit room.  You should check your breasts every month. If you get menstrual periods, the best time to check your breasts is 5-7 days after your menstrual period is over.  Let your doctor know of any changes you see in your breasts, including changes in size, changes on the skin, pain or tenderness, or fluid from your nipples that is not normal. This information is not intended to replace advice given to you by your health care provider. Make sure you discuss any questions you have with your health care provider. Document Revised: 02/14/2018 Document Reviewed: 02/14/2018 Elsevier Patient Education  2020 Elsevier Inc.   Preparing for Pregnancy If you are considering becoming pregnant, make an appointment to see your regular health care provider to learn how to  prepare for a safe and healthy pregnancy (preconception care). During a preconception care visit, your health care provider will:  Do a complete physical exam, including a Pap test.  Take a complete medical history.  Give you information, answer your questions, and help you resolve problems. Preconception checklist Medical history  Tell your health care provider about any current or past medical conditions. Your pregnancy or your ability to   become pregnant may be affected by chronic conditions, such as diabetes, chronic hypertension, and thyroid problems.  Include your family's medical history as well as your partner's medical history.  Tell your health care provider about any history of STIs (sexually transmitted infections).These can affect your pregnancy. In some cases, they can be passed to your baby. Discuss any concerns that you have about STIs.  If indicated, discuss the benefits of genetic testing. This testing will show whether there are any genetic conditions that may be passed from you or your partner to your baby.  Tell your health care provider about: ? Any problems you have had with conception or pregnancy. ? Any medicines you take. These include vitamins, herbal supplements, and over-the-counter medicines. ? Your history of immunizations. Discuss any vaccinations that you may need. Diet  Ask your health care provider what to include in a healthy diet that has a balance of nutrients. This is especially important when you are pregnant or preparing to become pregnant.  Ask your health care provider to help you reach a healthy weight before pregnancy. ? If you are overweight, you may be at higher risk for certain complications, such as high blood pressure, diabetes, and preterm birth. ? If you are underweight, you are more likely to have a baby who has a low birth weight. Lifestyle, work, and home  Let your health care provider know: ? About any lifestyle habits that you  have, such as alcohol use, drug use, or smoking. ? About recreational activities that may put you at risk during pregnancy, such as downhill skiing and certain exercise programs. ? Tell your health care provider about any international travel, especially any travel to places with an active Zika virus outbreak. ? About harmful substances that you may be exposed to at work or at home. These include chemicals, pesticides, radiation, or even litter boxes. ? If you do not feel safe at home. Mental health  Tell your health care provider about: ? Any history of mental health conditions, including feelings of depression, sadness, or anxiety. ? Any medicines that you take for a mental health condition. These include herbs and supplements. Home instructions to prepare for pregnancy Lifestyle   Eat a balanced diet. This includes fresh fruits and vegetables, whole grains, lean meats, low-fat dairy products, healthy fats, and foods that are high in fiber. Ask to meet with a nutritionist or registered dietitian for assistance with meal planning and goals.  Get regular exercise. Try to be active for at least 30 minutes a day on most days of the week. Ask your health care provider which activities are safe during pregnancy.  Do not use any products that contain nicotine or tobacco, such as cigarettes and e-cigarettes. If you need help quitting, ask your health care provider.  Do not drink alcohol.  Do not take illegal drugs.  Maintain a healthy weight. Ask your health care provider what weight range is right for you. General instructions  Keep an accurate record of your menstrual periods. This makes it easier for your health care provider to determine your baby's due date.  Begin taking prenatal vitamins and folic acid supplements daily as directed by your health care provider.  Manage any chronic conditions, such as high blood pressure and diabetes, as told by your health care provider. This is  important. How do I know that I am pregnant? You may be pregnant if you have been sexually active and you miss your period. Symptoms of early pregnancy include:    Mild cramping.  Very light vaginal bleeding (spotting).  Feeling unusually tired.  Nausea and vomiting (morning sickness). If you have any of these symptoms and you suspect that you might be pregnant, you can take a home pregnancy test. These tests check for a hormone in your urine (human chorionic gonadotropin, or hCG). A woman's body begins to make this hormone during early pregnancy. These tests are very accurate. Wait until at least the first day after you miss your period to take one. If the test shows that you are pregnant (you get a positive result), call your health care provider to make an appointment for prenatal care. What should I do if I become pregnant?      Make an appointment with your health care provider as soon as you suspect you are pregnant.  Do not use any products that contain nicotine, such as cigarettes, chewing tobacco, and e-cigarettes. If you need help quitting, ask your health care provider.  Do not drink alcoholic beverages. Alcohol is related to a number of birth defects.  Avoid toxic odors and chemicals.  You may continue to have sexual intercourse if it does not cause pain or other problems, such as vaginal bleeding. This information is not intended to replace advice given to you by your health care provider. Make sure you discuss any questions you have with your health care provider. Document Revised: 06/30/2017 Document Reviewed: 01/18/2016 Elsevier Patient Education  2020 Elsevier Inc.  

## 2019-11-06 NOTE — Progress Notes (Signed)
Pt present for annual exam. Pt stated that she was doing well. Pt stated that she has questions concerning conceiving, birth control and covid vaccines.

## 2019-11-07 ENCOUNTER — Encounter: Payer: Self-pay | Admitting: Obstetrics and Gynecology

## 2019-11-07 DIAGNOSIS — Z8279 Family history of other congenital malformations, deformations and chromosomal abnormalities: Secondary | ICD-10-CM | POA: Insufficient documentation

## 2019-11-07 DIAGNOSIS — Z8759 Personal history of other complications of pregnancy, childbirth and the puerperium: Secondary | ICD-10-CM | POA: Insufficient documentation

## 2019-11-07 LAB — CBC
Hematocrit: 41.8 % (ref 34.0–46.6)
Hemoglobin: 14 g/dL (ref 11.1–15.9)
MCH: 29.3 pg (ref 26.6–33.0)
MCHC: 33.5 g/dL (ref 31.5–35.7)
MCV: 87 fL (ref 79–97)
Platelets: 230 10*3/uL (ref 150–450)
RBC: 4.78 x10E6/uL (ref 3.77–5.28)
RDW: 12.4 % (ref 11.7–15.4)
WBC: 7.6 10*3/uL (ref 3.4–10.8)

## 2019-11-07 LAB — COMPREHENSIVE METABOLIC PANEL
ALT: 22 IU/L (ref 0–32)
AST: 21 IU/L (ref 0–40)
Albumin/Globulin Ratio: 2 (ref 1.2–2.2)
Albumin: 4.8 g/dL (ref 3.8–4.8)
Alkaline Phosphatase: 76 IU/L (ref 39–117)
BUN/Creatinine Ratio: 15 (ref 9–23)
BUN: 11 mg/dL (ref 6–20)
Bilirubin Total: 0.4 mg/dL (ref 0.0–1.2)
CO2: 23 mmol/L (ref 20–29)
Calcium: 9.6 mg/dL (ref 8.7–10.2)
Chloride: 102 mmol/L (ref 96–106)
Creatinine, Ser: 0.73 mg/dL (ref 0.57–1.00)
GFR calc Af Amer: 123 mL/min/{1.73_m2} (ref >59)
GFR calc non Af Amer: 106 mL/min/{1.73_m2} (ref >59)
Globulin, Total: 2.4 g/dL (ref 1.5–4.5)
Glucose: 78 mg/dL (ref 65–99)
Potassium: 3.7 mmol/L (ref 3.5–5.2)
Sodium: 139 mmol/L (ref 134–144)
Total Protein: 7.2 g/dL (ref 6.0–8.5)

## 2019-11-07 LAB — THYROID PANEL WITH TSH
Free Thyroxine Index: 1.4 (ref 1.2–4.9)
T3 Uptake Ratio: 23 % — ABNORMAL LOW (ref 24–39)
T4, Total: 6.3 ug/dL (ref 4.5–12.0)
TSH: 2.29 u[IU]/mL (ref 0.450–4.500)

## 2019-11-07 LAB — HEMOGLOBIN A1C
Est. average glucose Bld gHb Est-mCnc: 105 mg/dL
Hgb A1c MFr Bld: 5.3 % (ref 4.8–5.6)

## 2019-11-07 LAB — LIPID PANEL
Chol/HDL Ratio: 4.4 ratio (ref 0.0–4.4)
Cholesterol, Total: 209 mg/dL — ABNORMAL HIGH (ref 100–199)
HDL: 48 mg/dL (ref >39)
LDL Chol Calc (NIH): 137 mg/dL — ABNORMAL HIGH (ref 0–99)
Triglycerides: 135 mg/dL (ref 0–149)
VLDL Cholesterol Cal: 24 mg/dL (ref 5–40)

## 2019-11-10 ENCOUNTER — Ambulatory Visit (INDEPENDENT_AMBULATORY_CARE_PROVIDER_SITE_OTHER)
Admission: RE | Admit: 2019-11-10 | Discharge: 2019-11-10 | Disposition: A | Discharge status: Home / Self Care | Payer: BC Managed Care – PPO | Source: Ambulatory Visit

## 2019-11-10 DIAGNOSIS — N760 Acute vaginitis: Secondary | ICD-10-CM

## 2019-11-10 MED ORDER — FLUCONAZOLE 150 MG PO TABS
150.0000 mg | ORAL_TABLET | Freq: Once | ORAL | 0 refills | Status: AC
Start: 2019-11-10 — End: 2019-11-10

## 2019-11-10 NOTE — Discharge Instructions (Addendum)
May diflucan today, repeat in 3-4 days if not improving [phone]

## 2019-11-10 NOTE — ED Provider Notes (Signed)
Virtual Visit via Video Note:  [name]  initiated request for Telemedicine visit with [facility] team. I connected with Rosalin Hawking  on 11/10/2019 at 11:38 AM  for a synchronized telemedicine visit using a video enabled HIPPA compliant telemedicine application. I verified that I am speaking with Rosalin Hawking  using two identifiers. Avelynn Sellin C Sultana Tierney, PA-C  was physically located in a [facility]gent [facility] site and LANEAH LUFT was located at a different location.   The limitations of evaluation and management by telemedicine as well as the availability of in-person appointments were discussed. Patient was informed that she  may incur a bill ( including co-pay) for this virtual visit encounter. Rosalin Hawking  expressed understanding and gave verbal consent to proceed with virtual visit.     History of Present Illness:Maria Drake  is a 39 y.o. female presents for evaluation of possible yeast infection.  Patient notes that she recently had her annual follow-up visit with OB/GYN this past week.  She is not having any symptoms of time, over the past couple days she has developed increased itching as well as some white discharge.  She used over-the-counter Monistat but symptoms have not fully resolved.  She has had prior yeast infections and believes this feels similar.  Last menstrual cycle started last night.  She reports use of oral contraceptives.  Denies concerns for STDs.     Allergies  Allergen Reactions  . Decongest-Aid [Pseudoephedrine] Anaphylaxis    tachycardia  . Codeine Nausea And Vomiting     Past Medical History:  Diagnosis Date  . Endometrial disorder   . Endometriosis   . Environmental allergies   . Gestational diabetes   . Gestational hypertension   . Headache   . Hyperthyroidism   . PCOS (polycystic ovarian syndrome)   . Tachycardia      Social History   Tobacco Use  . Smoking status: Never Smoker  . Smokeless tobacco: Never  Used  Substance Use Topics  . Alcohol use: No  . Drug use: No        Observations/Objective: Physical Exam  Constitutional: She is oriented to person, place, and time and well-developed, well-nourished, and in no distress. No distress.  HENT:  Head: Normocephalic and atraumatic.  Pulmonary/Chest: Effort normal. No respiratory distress.  Speaking in full sentences  Musculoskeletal:     Cervical back: Normal range of motion.  Neurological: She is alert and oriented to person, place, and time.  Speech clear, face symmetric     Assessment and Plan:  Acute vaginitis  Empirically treating for yeast vaginitis with Diflucan.  Advised to follow-up in person for further evaluation of symptoms of symptoms persisting despite use of Diflucan.  Discussed strict return precautions. Patient verbalized understanding and is agreeable with plan.    Follow Up Instructions:     I discussed the assessment and treatment plan with the patient. The patient was provided an opportunity to ask questions and all were answered. The patient agreed with the plan and demonstrated an understanding of the instructions.   The patient was advised to call back or seek an in-person evaluation if the symptoms worsen or if the condition fails to improve as anticipated.      Lew Dawes, PA-C  11/10/2019 11:38 AM         Lew Dawes, PA-C 11/11/19 [phone]

## 2019-11-15 DIAGNOSIS — H21233 Degeneration of iris (pigmentary), bilateral: Secondary | ICD-10-CM | POA: Diagnosis not present

## 2019-11-20 ENCOUNTER — Other Ambulatory Visit: Payer: Self-pay

## 2019-11-20 DIAGNOSIS — H0012 Chalazion right lower eyelid: Secondary | ICD-10-CM | POA: Diagnosis not present

## 2019-11-20 MED ORDER — NORETHIN-ETH ESTRAD-FE BIPHAS 1 MG-10 MCG / 10 MCG PO TABS
1.0000 | ORAL_TABLET | Freq: Every day | ORAL | 3 refills | Status: DC
Start: 2019-11-20 — End: 2020-05-11

## 2019-11-27 ENCOUNTER — Ambulatory Visit: Payer: BC Managed Care – PPO | Attending: Internal Medicine

## 2019-11-27 DIAGNOSIS — Z23 Encounter for immunization: Secondary | ICD-10-CM

## 2019-11-27 NOTE — Progress Notes (Signed)
   Covid-19 Vaccination Clinic  Name:  SHALIA BARTKO    MRN: [id number] DOB: 24-Nov-1980  11/27/2019  Ms. Warshawsky was observed post Covid-19 immunization for 15 minutes without incident. She was provided with Vaccine Information Sheet and instruction to access the V-Safe system.   Ms. Branscum was instructed to call 911 with any severe reactions post vaccine: Marland Kitchen Difficulty breathing  . Swelling of face and throat  . A fast heartbeat  . A bad rash all over body  . Dizziness and weakness   Immunizations Administered    Name Date Dose VIS Date Route   Pfizer COVID-19 Vaccine 11/27/2019  2:12 PM 0.3 mL 09/05/2018 Intramuscular   Manufacturer: [employer], [employer]   Lot: [id number]   NDC: [address]-8

## 2019-11-30 IMAGING — US US ABDOMEN COMPLETE
1 series · 13 of 25 positions shown · non-contrast
Comparison: None.

CLINICAL DATA: 37-year-old female with RIGHT-sided abdominal pain.
Six weeks postpartum..

EXAM:
ABDOMEN ULTRASOUND COMPLETE

[Series 1: us abdomen complete · 0.23mm/px · 13 of 110 slices shown]
[im 1/110]
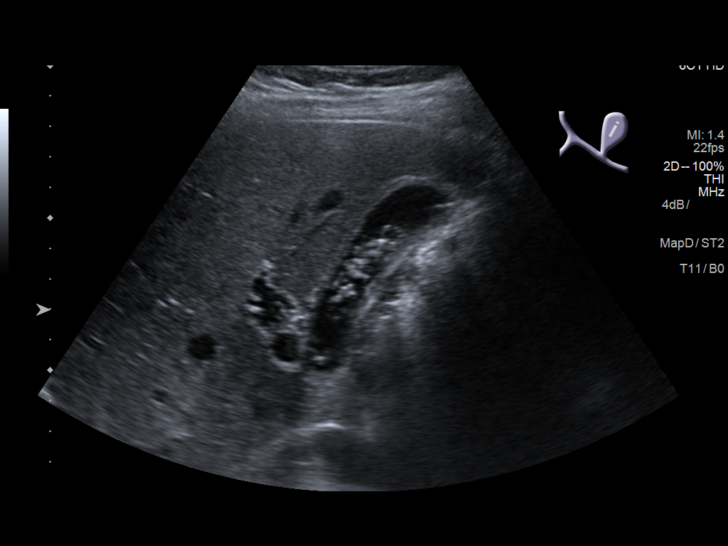
[im 10/110]
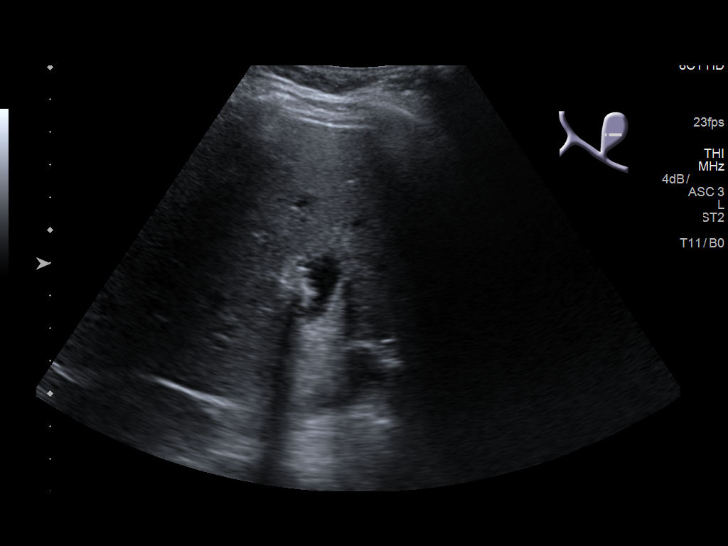
[im 19/110]
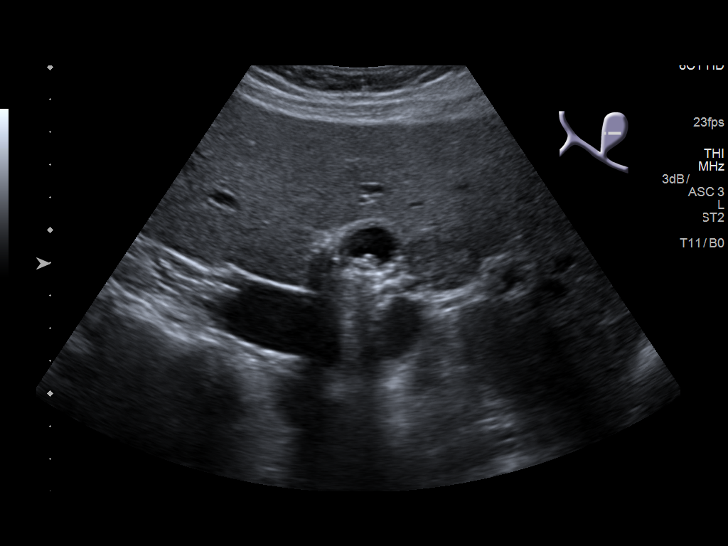
[im 28/110]
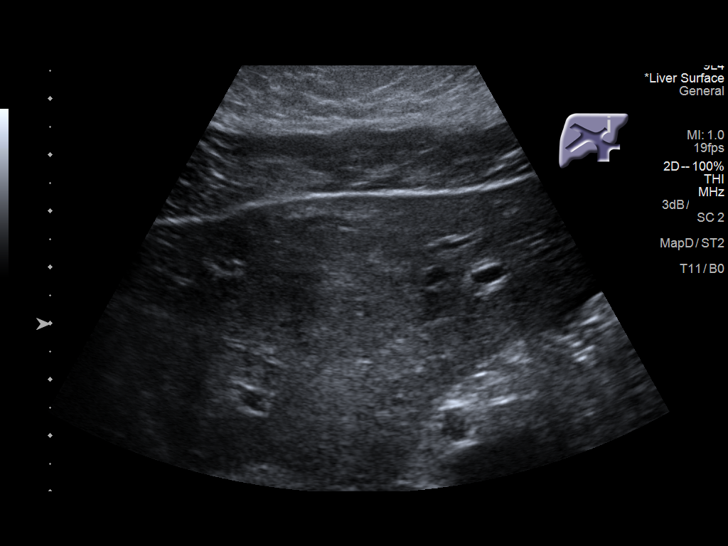
[im 37/110]
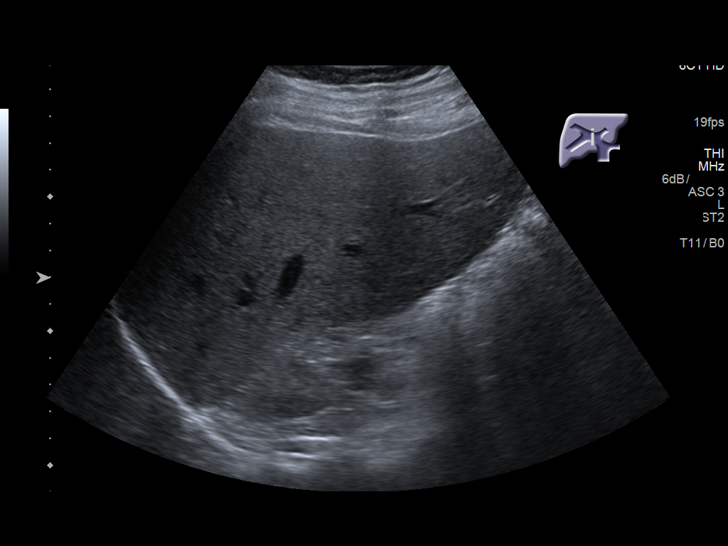
[im 46/110]
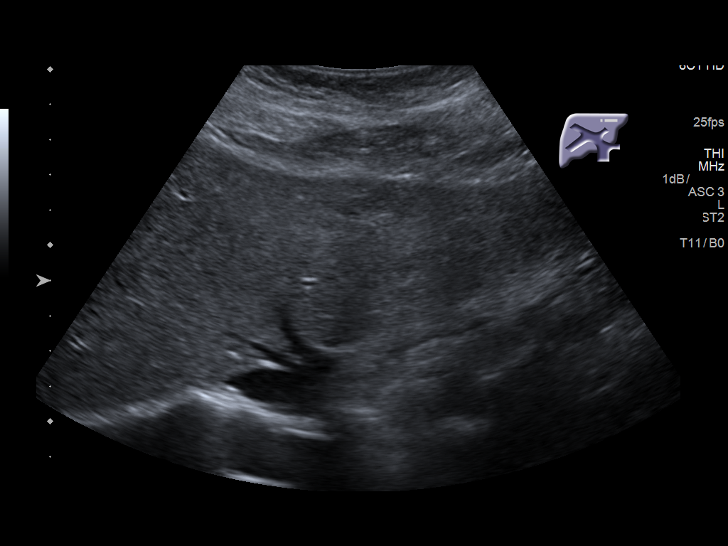
[im 55/110]
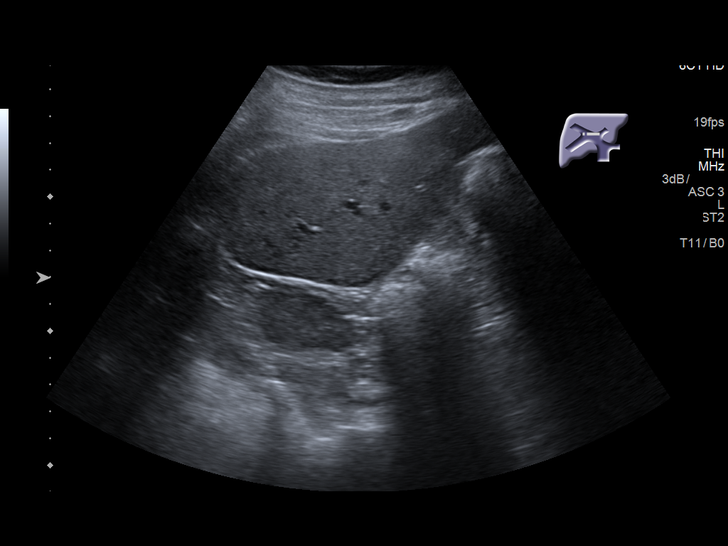
[im 64/110]
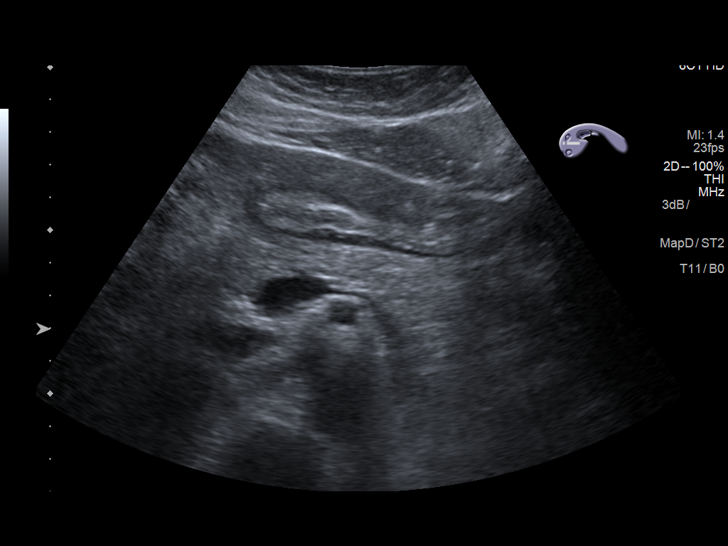
[im 73/110]
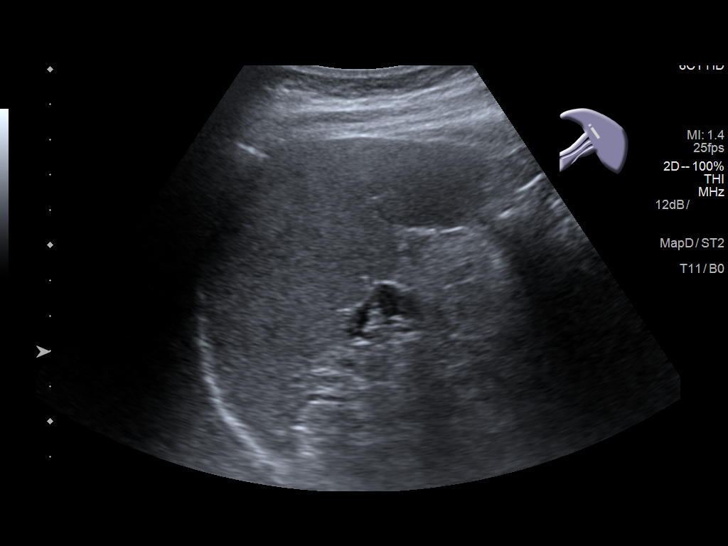
[im 82/110]
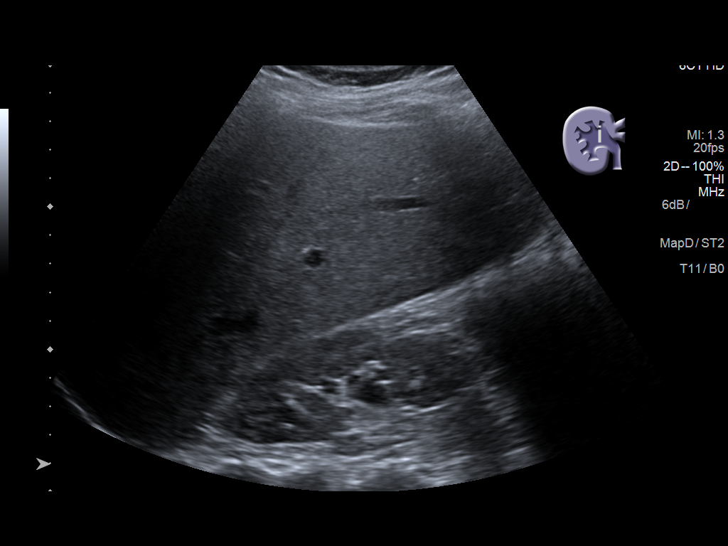
[im 91/110]
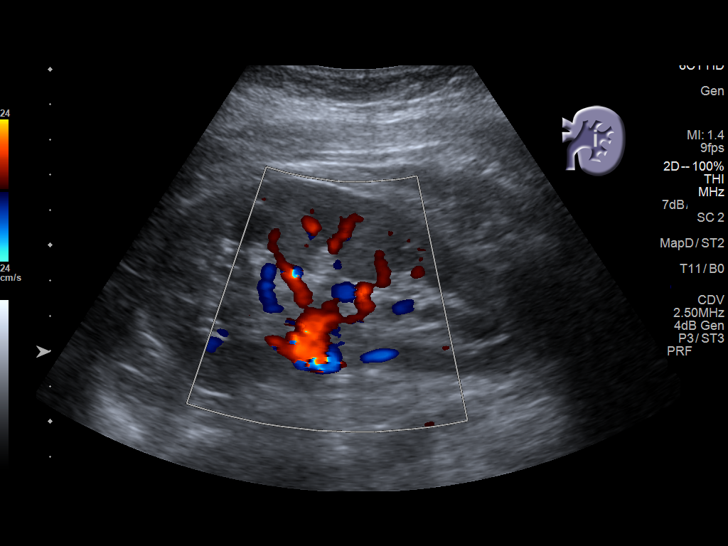
[im 100/110]
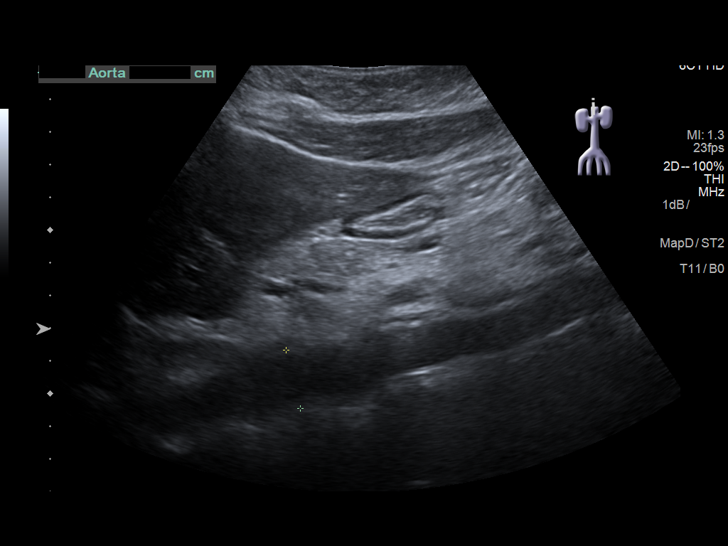
[im 110/110]
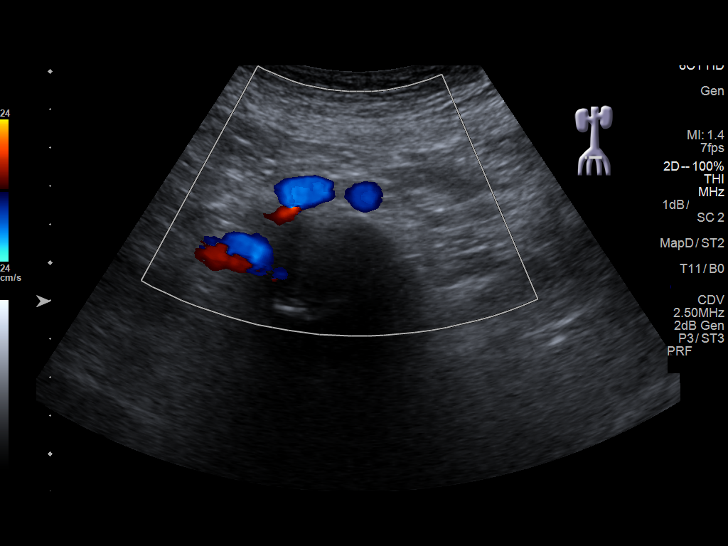

[13 of 25 positions shown; findings below may reference images not displayed]

FINDINGS: Gallbladder: Multiple small mobile gallstones and gallbladder sludge
identified. Mild gallbladder wall thickening is identified. No
pericholecystic fluid or sonographic Murphy sign identified.

Common bile duct: Diameter: 3 mm. No intrahepatic or extrahepatic
biliary dilatation.

Liver: A 3.7 x 2.9 x 3.3 cm ill-defined hypoechoic mass within the
LEFT liver is noted. No other focal hepatic abnormalities are
present. Portal vein is patent on color Doppler imaging with normal
direction of blood flow towards the liver.

IVC: No abnormality visualized.

Pancreas: Visualized portion unremarkable.

Spleen: Size and appearance within normal limits.

Right Kidney: Length: 9.6 cm. Echogenicity within normal limits. No
mass or hydronephrosis visualized.

Left Kidney: Length: 12 cm. Echogenicity within normal limits. No
mass or hydronephrosis visualized.

Abdominal aorta: No aneurysm visualized.

Other findings: None.
IMPRESSION: 1. Cholelithiasis with mild gallbladder wall thickening, but without
sonographic Murphy sign or pericholecystic fluid - equivocal for
acute cholecystitis. Consider nuclear medicine study as clinically
indicated.
2. 3.7 cm hypoechoic LEFT hepatic mass. MRI of the liver with and
without contrast is recommended for further evaluation.

## 2019-12-04 ENCOUNTER — Telehealth: Payer: Self-pay | Admitting: Obstetrics and Gynecology

## 2019-12-04 NOTE — Telephone Encounter (Signed)
Pt called in and stated that she came in at the end of April. She was put on [facility]. The pt is 2 and a half weeks on the [facility] and she got her period. The pt has some questions on if she should continue taking the meds or go to the last week on the pack. Please advise

## 2019-12-06 NOTE — Telephone Encounter (Signed)
Please advise. Thanks Brennen Gardiner 

## 2019-12-07 NOTE — Telephone Encounter (Signed)
Yes, she should continue it the way she has been taking it.  It may take up to 2-3 packs of the pills to regulate her cycles since she recently stopped breastfeeding. And I'm not sure where in her cycle she began the pills (If she started on the Sunday after her last period, or if she started midway into her cycle). Let her know that she will regulate if she continues to use.

## 2019-12-12 NOTE — Telephone Encounter (Signed)
Pt is aware of information given by AC.  °

## 2019-12-18 ENCOUNTER — Ambulatory Visit: Payer: BC Managed Care – PPO | Attending: Internal Medicine

## 2019-12-18 DIAGNOSIS — Z23 Encounter for immunization: Secondary | ICD-10-CM

## 2019-12-18 NOTE — Progress Notes (Signed)
° °  Covid-19 Vaccination Clinic  Name:  Maria Drake    MRN: [id number] DOB: July 10, 1981  12/18/2019  Ms. Bound was observed post Covid-19 immunization for 15 minutes3 without incident. She was provided with Vaccine Information Sheet and instruction to access the V-Safe system.   [name] was instructed to call 911 with any severe reactions post vaccine:  Difficulty breathing   Swelling of face and throat   A fast heartbeat   A bad rash all over body   Dizziness and weakness   Immunizations Administered    Name Date Dose VIS Date Route   Pfizer COVID-19 Vaccine 12/18/2019  2:05 PM 0.3 mL 09/05/2018 Intramuscular   Manufacturer: [employer], [employer]   Lot: [id number]   NDC: [address]-4

## 2020-04-22 DIAGNOSIS — D225 Melanocytic nevi of trunk: Secondary | ICD-10-CM | POA: Diagnosis not present

## 2020-04-22 DIAGNOSIS — D2262 Melanocytic nevi of left upper limb, including shoulder: Secondary | ICD-10-CM | POA: Diagnosis not present

## 2020-04-22 DIAGNOSIS — B078 Other viral warts: Secondary | ICD-10-CM | POA: Diagnosis not present

## 2020-04-22 DIAGNOSIS — D2261 Melanocytic nevi of right upper limb, including shoulder: Secondary | ICD-10-CM | POA: Diagnosis not present

## 2020-04-22 DIAGNOSIS — L538 Other specified erythematous conditions: Secondary | ICD-10-CM | POA: Diagnosis not present

## 2020-04-22 DIAGNOSIS — D2271 Melanocytic nevi of right lower limb, including hip: Secondary | ICD-10-CM | POA: Diagnosis not present

## 2020-05-11 ENCOUNTER — Encounter: Payer: Self-pay | Admitting: Emergency Medicine

## 2020-05-11 ENCOUNTER — Ambulatory Visit
Admission: EM | Admit: 2020-05-11 | Discharge: 2020-05-11 | Disposition: A | Discharge status: Home / Self Care | Payer: BC Managed Care – PPO | Attending: Physician Assistant | Admitting: Physician Assistant

## 2020-05-11 ENCOUNTER — Other Ambulatory Visit: Payer: Self-pay

## 2020-05-11 DIAGNOSIS — J0110 Acute frontal sinusitis, unspecified: Secondary | ICD-10-CM | POA: Insufficient documentation

## 2020-05-11 DIAGNOSIS — Z20822 Contact with and (suspected) exposure to covid-19: Secondary | ICD-10-CM | POA: Insufficient documentation

## 2020-05-11 MED ORDER — BENZONATATE 100 MG PO CAPS
100.0000 mg | ORAL_CAPSULE | Freq: Three times a day (TID) | ORAL | 0 refills | Status: DC
Start: 2020-05-11 — End: 2020-06-01

## 2020-05-11 MED ORDER — AMOXICILLIN-POT CLAVULANATE 875-125 MG PO TABS
1.0000 | ORAL_TABLET | Freq: Two times a day (BID) | ORAL | 0 refills | Status: DC
Start: 2020-05-11 — End: 2020-06-01

## 2020-05-11 NOTE — ED Triage Notes (Signed)
Pt c/o sinus congestion, post nasal drip, headache, sinus pain/pressure and cough. Started about 3 days ago but worse this morning. Has been vaccinated for covid and also had covid 9 months ago.

## 2020-05-11 NOTE — Discharge Instructions (Addendum)
-  Take medications as directed. -Continue with Mucinex and OTC medications as needed. -Self isolate until results from COVID test are known. -Office will contact you with results.

## 2020-05-11 NOTE — ED Provider Notes (Signed)
[facility]    CSN: [id number] Arrival date & time: 05/11/20  0843      History   Chief Complaint Chief Complaint  Patient presents with  . Cough    HPI Maria Drake is a 39 y.o. female who presents today for evaluation of ongoing facial pressure, sore throat, cough and postnasal drainage.  The patient states that starting around 4 or 5 days ago she began to experience some chest pressure, [place], after this the patient reports that her son has been started on antibiotics for a sinus infection.  Her son was tested for COVID-19 and he was negative, the patient had COVID-19 earlier this year and has received both of her vaccines.  The patient states that over the past 48 hours she has began to experience worsening symptoms of her facial pressure, postnasal drainage, she reports a dry cough.  She denies any fevers or chills at home.  She is currently taking Mucinex in addition to Zicam.  The patient is not pregnant, she denies any shortness of breath, denies any chest pain at today's visit.  Her and her husband are actively trying to have a second child.  HPI  Past Medical History:  Diagnosis Date  . Endometrial disorder   . Endometriosis   . Environmental allergies   . Gestational diabetes   . Gestational hypertension   . Headache   . Hyperthyroidism   . PCOS (polycystic ovarian syndrome)   . Tachycardia     Patient Active Problem List   Diagnosis Date Noted  . History of gestational hypertension 11/07/2019  . Family history of congenital hydrocephalus 11/07/2019  . Calculus of gallbladder without cholecystitis without obstruction   . History of gestational diabetes 05/17/2018  . 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 Hypothyroidism 09/27/2017  . Endometriosis of pelvis 03/17/2016  . Rhinitis, allergic 03/17/2016  . Oligo-ovulation 03/17/2016  . Acne 03/17/2016  . PCOS (polycystic ovarian syndrome) 03/17/2016  .  Anxiety 03/17/2016    Past Surgical History:  Procedure Laterality Date  . CESAREAN SECTION    . LAPAROSCOPIC ENDOMETRIOSIS FULGURATION    . ROBOTIC ASSISTED LAPAROSCOPIC CHOLECYSTECTOMY-MULTI SITE N/A 06/22/2018   Procedure: ROBOTIC ASSISTED LAPAROSCOPIC CHOLECYSTECTOMY-MULTI SITE;  Surgeon: Leafy Ro, MD;  Location: [facility] ORS;  Service: General;  Laterality: N/A;  . WISDOM TOOTH EXTRACTION      OB History    Gravida  1   Para  1   Term  1   Preterm      AB      Living  1     SAB      TAB      Ectopic      Multiple      Live Births  1            Home Medications    Prior to Admission medications   Medication Sig Start Date End Date Taking? Authorizing Provider  acetaminophen (TYLENOL) 500 MG tablet Take 500 mg by mouth every 6 (six) hours as needed for moderate pain.    Yes [provider]  cetirizine (ZYRTEC) 10 MG tablet Take 10 mg by mouth daily as needed for allergies.    Yes [provider]  fluticasone (FLONASE) 50 MCG/ACT nasal spray Place 2 sprays into the nose daily as needed for allergies.  09/03/15  Yes [provider]  ibuprofen (ADVIL,MOTRIN) 200 MG tablet Take 200 mg by mouth every 6 (six) hours  as needed.   Yes [provider]  polyethylene glycol (MIRALAX / GLYCOLAX) packet Take 17 g by mouth daily as needed.   Yes [provider]  Prenatal Vit-Fe Fumarate-FA (PRENATAL MULTIVITAMIN) TABS tablet Take 1 tablet by mouth daily at 12 noon.   Yes [provider]  amoxicillin-clavulanate (AUGMENTIN) 875-125 MG tablet Take 1 tablet by mouth every 12 (twelve) hours. 05/11/20   Anson Oregon, PA-C  benzonatate (TESSALON) 100 MG capsule Take 1 capsule (100 mg total) by mouth every 8 (eight) hours. 05/11/20   Anson Oregon, PA-C  Norethindrone-Ethinyl Estradiol-Fe Biphas (LO LOESTRIN FE) 1 MG-10 MCG / 10 MCG tablet Take 1 tablet by mouth daily. 11/20/19 05/11/20  Hildred Laser, MD  NORLYDA  0.35 MG tablet TAKE 1 TABLET(0.35 MG) BY MOUTH DAILY 10/15/19 05/11/20  Hildred Laser, MD    Family History Family History  Problem Relation Age of Onset  . Diabetes Mother   . Heart disease Father   . Diabetes Paternal Uncle   . Diabetes Maternal Grandfather   . Diabetes Paternal Grandfather   . Heart disease Paternal Grandfather   . Breast cancer Neg Hx   . Ovarian cancer Neg Hx     Social History Social History   Tobacco Use  . Smoking status: Never Smoker  . Smokeless tobacco: Never Used  Vaping Use  . Vaping Use: Never used  Substance Use Topics  . Alcohol use: No  . Drug use: No     Allergies   Decongest-aid [pseudoephedrine] and Codeine   Review of Systems Review of Systems  Constitutional: Negative for fever.  HENT: Positive for congestion, ear pain, postnasal drip, rhinorrhea and sinus pressure.   Eyes: Negative.   Respiratory: Positive for cough.   Cardiovascular: Negative.   Gastrointestinal: Negative.   Endocrine: Negative.   Genitourinary: Negative.   Musculoskeletal: Negative.   Skin: Negative.   Allergic/Immunologic: Negative.   Neurological: Negative.   Hematological: Negative.   Psychiatric/Behavioral: Negative.    Physical Exam Triage Vital Signs ED Triage Vitals  Enc Vitals Group     BP 05/11/20 0858 121/89     Pulse Rate 05/11/20 0858 89     Resp 05/11/20 0858 18     Temp 05/11/20 0858 98.5 F (36.9 C)     Temp Source 05/11/20 0858 Oral     SpO2 05/11/20 0858 98 %     Weight 05/11/20 0854 158 lb 1.1 oz (71.7 kg)     Height 05/11/20 0854 5\' 7"  (1.702 m)     Head Circumference --      Peak Flow --      Pain Score 05/11/20 0853 2     Pain Loc --      Pain Edu? --      Excl. in GC? --    No data found.  Updated Vital Signs BP 121/89 (BP Location: Left Arm)   Pulse 89   Temp 98.5 F (36.9 C) (Oral)   Resp 18   Ht 5\' 7"  (1.702 m)   Wt 158 lb 1.1 oz (71.7 kg)   LMP 05/04/2020 (Approximate)   SpO2 98%   BMI 24.76 kg/m    Visual Acuity Right Eye Distance:   Left Eye Distance:   Bilateral Distance:    Right Eye Near:   Left Eye Near:    Bilateral Near:     Physical Exam Constitutional:      General: She is not in acute distress.    Appearance:  Normal appearance. She is well-developed. She is not ill-appearing or toxic-appearing.  HENT:     Head: Normocephalic and atraumatic.     Ears:     Comments: Mild bulging of tympanic membranes, no erythema or evidence of rupture.    Nose:     Comments: Mild bilateral nasal canal erythema    Mouth/Throat:     Comments: Postnasal drainage evident. Eyes:     General: Lids are normal.     Extraocular Movements: Extraocular movements intact.     Conjunctiva/sclera: Conjunctivae normal.  Cardiovascular:     Rate and Rhythm: Normal rate and regular rhythm.     Heart sounds: S1 normal and S2 normal. No murmur heard.  No friction rub. No gallop.   Pulmonary:     Effort: Pulmonary effort is normal.     Breath sounds: Normal breath sounds. No decreased breath sounds, wheezing, rhonchi or rales.  Lymphadenopathy:     Cervical: No cervical adenopathy.    UC Treatments / Results  Labs (all labs ordered are listed, but only abnormal results are displayed) Labs Reviewed  SARS CORONAVIRUS 2 (TAT 6-24 HRS)    EKG   Radiology No results found.  Procedures Procedures (including critical care time)  Medications Ordered in UC Medications - No data to display  Initial Impression / Assessment and Plan / UC Course  I have reviewed the triage vital signs and the nursing notes.  Pertinent labs & imaging results that were available during my care of the patient were reviewed by me and considered in my medical decision making (see chart for details).     1.  Treatment options were discussed today with the patient. 2.  We will test the patient for COVID-19, patient was instructed to self isolate until results of the tests are known. 3.  I do believe the  patient is developing a bacterial sinus infection.  We will treat with Augmentin, patient was also given a prescription of Tessalon Perles as needed if cough worsens. 4.  If COVID-19 results are positive will need to self isolate for 10-14 days. Final Clinical Impressions(s) / UC Diagnoses   Final diagnoses:  Acute non-recurrent frontal sinusitis     Discharge Instructions     -Take medications as directed. -Continue with Mucinex and OTC medications as needed. -Self isolate until results from COVID test are known. -Office will contact you with results.   ED Prescriptions    Medication Sig Dispense Auth. Provider   amoxicillin-clavulanate (AUGMENTIN) 875-125 MG tablet Take 1 tablet by mouth every 12 (twelve) hours. 20 tablet Anson Oregon, PA-C   benzonatate (TESSALON) 100 MG capsule Take 1 capsule (100 mg total) by mouth every 8 (eight) hours. 21 capsule Anson Oregon, PA-C     PDMP not reviewed this encounter.   Anson Oregon, [place] 05/11/20 [phone]

## 2020-05-12 LAB — SARS CORONAVIRUS 2 (TAT 6-24 HRS): SARS Coronavirus 2: NEGATIVE

## 2020-06-01 ENCOUNTER — Other Ambulatory Visit: Payer: Self-pay

## 2020-06-01 ENCOUNTER — Encounter: Payer: Self-pay | Admitting: Emergency Medicine

## 2020-06-01 ENCOUNTER — Ambulatory Visit
Admission: EM | Admit: 2020-06-01 | Discharge: 2020-06-01 | Disposition: A | Discharge status: Home / Self Care | Payer: BC Managed Care – PPO | Attending: Family Medicine | Admitting: Family Medicine

## 2020-06-01 DIAGNOSIS — Z20822 Contact with and (suspected) exposure to covid-19: Secondary | ICD-10-CM | POA: Insufficient documentation

## 2020-06-01 DIAGNOSIS — H6982 Other specified disorders of Eustachian tube, left ear: Secondary | ICD-10-CM | POA: Insufficient documentation

## 2020-06-01 LAB — RESP PANEL BY RT-PCR (FLU A&B, COVID) ARPGX2
Influenza A by PCR: NEGATIVE
Influenza B by PCR: NEGATIVE
SARS Coronavirus 2 by RT PCR: NEGATIVE

## 2020-06-01 NOTE — Discharge Instructions (Addendum)
Over the counter Antihistamine.  Flonase as well.  This should improve without any other intervention.  If persists, recommend seeing ENT.  Take care  Dr. Adriana Simas

## 2020-06-01 NOTE — ED Provider Notes (Signed)
[facility]    CSN: [id number] Arrival date & time: 06/01/20  1013      History   Chief Complaint Chief Complaint  Patient presents with  . Otalgia    left   HPI  39 year old female presents with the above complaint.  Patient reports that she has had ongoing ear pain over the past 2 weeks.  Has been worsening as of Friday and was particular bothersome today.  She denies fever.  She states that she has had some postnasal drip/drainage.  No other respiratory symptoms.  No fever.  Her son is recently been sick.  Patient is concerned that she has an ear infection.  Patient desires Covid testing today.  No other complaints.  Past Medical History:  Diagnosis Date  . Endometrial disorder   . Endometriosis   . Environmental allergies   . Gestational diabetes   . Gestational hypertension   . Headache   . Hyperthyroidism   . PCOS (polycystic ovarian syndrome)   . Tachycardia     Patient Active Problem List   Diagnosis Date Noted  . History of gestational hypertension 11/07/2019  . Family history of congenital hydrocephalus 11/07/2019  . Calculus of gallbladder without cholecystitis without obstruction   . History of gestational diabetes 05/17/2018  . 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 Hypothyroidism 09/27/2017  . Endometriosis of pelvis 03/17/2016  . Rhinitis, allergic 03/17/2016  . Oligo-ovulation 03/17/2016  . Acne 03/17/2016  . PCOS (polycystic ovarian syndrome) 03/17/2016  . Anxiety 03/17/2016    Past Surgical History:  Procedure Laterality Date  . CESAREAN SECTION    . LAPAROSCOPIC ENDOMETRIOSIS FULGURATION    . ROBOTIC ASSISTED LAPAROSCOPIC CHOLECYSTECTOMY-MULTI SITE N/A 06/22/2018   Procedure: ROBOTIC ASSISTED LAPAROSCOPIC CHOLECYSTECTOMY-MULTI SITE;  Surgeon: Leafy Ro, MD;  Location: [facility] ORS;  Service: General;  Laterality: N/A;  . WISDOM TOOTH EXTRACTION      OB History    Gravida  1   Para  1   Term  1   Preterm      AB       Living  1     SAB      TAB      Ectopic      Multiple      Live Births  1            Home Medications    Prior to Admission medications   Medication Sig Start Date End Date Taking? Authorizing Provider  Prenatal Vit-Fe Fumarate-FA (PRENATAL MULTIVITAMIN) TABS tablet Take 1 tablet by mouth daily at 12 noon.   Yes [provider]  fluticasone (FLONASE) 50 MCG/ACT nasal spray Place 2 sprays into the nose daily as needed for allergies.  09/03/15 06/01/20 Yes [provider]  acetaminophen (TYLENOL) 500 MG tablet Take 500 mg by mouth every 6 (six) hours as needed for moderate pain.     [provider]  ibuprofen (ADVIL,MOTRIN) 200 MG tablet Take 200 mg by mouth every 6 (six) hours as needed.    [provider]  polyethylene glycol (MIRALAX / GLYCOLAX) packet Take 17 g by mouth daily as needed.    [provider]  Norethindrone-Ethinyl Estradiol-Fe Biphas (LO LOESTRIN FE) 1 MG-10 MCG / 10 MCG tablet Take 1 tablet by mouth daily. 11/20/19 05/11/20  Hildred Laser, MD  NORLYDA 0.35 MG tablet TAKE 1 TABLET(0.35 MG) BY MOUTH DAILY 10/15/19 05/11/20  Hildred Laser, MD    Family History Family History  Problem Relation Age of  Onset  . Diabetes Mother   . Heart disease Father   . Diabetes Paternal Uncle   . Diabetes Maternal Grandfather   . Diabetes Paternal Grandfather   . Heart disease Paternal Grandfather   . Breast cancer Neg Hx   . Ovarian cancer Neg Hx     Social History Social History   Tobacco Use  . Smoking status: Never Smoker  . Smokeless tobacco: Never Used  Vaping Use  . Vaping Use: Never used  Substance Use Topics  . Alcohol use: No  . Drug use: No     Allergies   Decongest-aid [pseudoephedrine] and Codeine   Review of Systems Review of Systems  Constitutional: Negative for fever.  HENT: Positive for ear pain and postnasal drip.    Physical Exam Triage Vital Signs ED Triage Vitals  Enc Vitals Group     BP  06/01/20 1057 132/86     Pulse Rate 06/01/20 1057 92     Resp 06/01/20 1057 14     Temp 06/01/20 1057 98.4 F (36.9 C)     Temp Source 06/01/20 1057 Oral     SpO2 06/01/20 1057 100 %     Weight 06/01/20 1055 150 lb (68 kg)     Height 06/01/20 1055 5\' 3"  (1.6 m)     Head Circumference --      Peak Flow --      Pain Score 06/01/20 1054 4     Pain Loc --      Pain Edu? --      Excl. in GC? --    Updated Vital Signs BP 132/86 (BP Location: Left Arm)   Pulse 92   Temp 98.4 F (36.9 C) (Oral)   Resp 14   Ht 5\' 3"  (1.6 m)   Wt 68 kg   LMP 05/04/2020 (Approximate)   SpO2 100%   BMI 26.57 kg/m   Visual Acuity Right Eye Distance:   Left Eye Distance:   Bilateral Distance:    Right Eye Near:   Left Eye Near:    Bilateral Near:     Physical Exam Constitutional:      General: She is not in acute distress.    Appearance: Normal appearance. She is not ill-appearing.  HENT:     Head: Normocephalic and atraumatic.     Right Ear: Tympanic membrane normal.     Left Ear: Tympanic membrane normal.  Eyes:     General:        Right eye: No discharge.        Left eye: No discharge.     Conjunctiva/sclera: Conjunctivae normal.  Cardiovascular:     Rate and Rhythm: Normal rate and regular rhythm.     Heart sounds: No murmur heard.   Pulmonary:     Effort: Pulmonary effort is normal.     Breath sounds: Normal breath sounds. No wheezing, rhonchi or rales.  Neurological:     Mental Status: She is alert.  Psychiatric:        Mood and Affect: Mood normal.        Behavior: Behavior normal.    UC Treatments / Results  Labs (all labs ordered are listed, but only abnormal results are displayed) Labs Reviewed  RESP PANEL BY RT-PCR (FLU A&B, COVID) [id number]    EKG   Radiology No results found.  Procedures Procedures (including critical care time)  Medications Ordered in UC Medications - No data to display  Initial Impression / Assessment and Plan /  UC Course  I have  reviewed the triage vital signs and the nursing notes.  Pertinent labs & imaging results that were available during my care of the patient were reviewed by me and considered in my medical decision making (see chart for details).    39 year old female presents with eustachian tube dysfunction.  Advise treatment with over-the-counter antihistamine and Flonase.  Flu and Covid testing negative today.  Advised to see ENT if persist.  Final Clinical Impressions(s) / UC Diagnoses   Final diagnoses:  Dysfunction of left eustachian tube     Discharge Instructions     Over the counter Antihistamine.  Flonase as well.  This should improve without any other intervention.  If persists, recommend seeing ENT.  Take care  Dr. Adriana Simas    ED Prescriptions    None     PDMP not reviewed this encounter.   Tommie Sams, [place] 06/01/20 1337

## 2020-06-01 NOTE — ED Triage Notes (Signed)
Patient c/o left ear pain that started earlier in the week.  Patient denies fevers. Patient states that she put some drops of essential oil in her left ear.

## 2020-07-01 DIAGNOSIS — Z03818 Encounter for observation for suspected exposure to other biological agents ruled out: Secondary | ICD-10-CM | POA: Diagnosis not present

## 2020-07-01 DIAGNOSIS — Z1152 Encounter for screening for COVID-19: Secondary | ICD-10-CM | POA: Diagnosis not present

## 2020-09-27 ENCOUNTER — Encounter: Payer: Self-pay | Admitting: Emergency Medicine

## 2020-09-27 ENCOUNTER — Ambulatory Visit
Admission: EM | Admit: 2020-09-27 | Discharge: 2020-09-27 | Disposition: A | Discharge status: Home / Self Care | Payer: BC Managed Care – PPO | Attending: Emergency Medicine | Admitting: Emergency Medicine

## 2020-09-27 ENCOUNTER — Other Ambulatory Visit: Payer: Self-pay

## 2020-09-27 DIAGNOSIS — J329 Chronic sinusitis, unspecified: Secondary | ICD-10-CM

## 2020-09-27 DIAGNOSIS — Z885 Allergy status to narcotic agent status: Secondary | ICD-10-CM | POA: Insufficient documentation

## 2020-09-27 DIAGNOSIS — B9789 Other viral agents as the cause of diseases classified elsewhere: Secondary | ICD-10-CM | POA: Insufficient documentation

## 2020-09-27 DIAGNOSIS — Z20822 Contact with and (suspected) exposure to covid-19: Secondary | ICD-10-CM | POA: Diagnosis not present

## 2020-09-27 DIAGNOSIS — J00 Acute nasopharyngitis [common cold]: Secondary | ICD-10-CM | POA: Insufficient documentation

## 2020-09-27 DIAGNOSIS — H9203 Otalgia, bilateral: Secondary | ICD-10-CM | POA: Diagnosis not present

## 2020-09-27 DIAGNOSIS — J029 Acute pharyngitis, unspecified: Secondary | ICD-10-CM | POA: Diagnosis present

## 2020-09-27 LAB — GROUP A STREP BY PCR: Group A Strep by PCR: NOT DETECTED

## 2020-09-27 LAB — PREGNANCY, URINE: Preg Test, Ur: NEGATIVE

## 2020-09-27 MED ORDER — AMOXICILLIN 875 MG PO TABS
875.0000 mg | ORAL_TABLET | Freq: Two times a day (BID) | ORAL | 0 refills | Status: DC
Start: 2020-09-27 — End: 2021-02-28

## 2020-09-27 MED ORDER — PREDNISONE 10 MG (21) PO TBPK
ORAL_TABLET | ORAL | 0 refills | Status: DC
Start: 2020-09-27 — End: 2021-02-28

## 2020-09-27 NOTE — Discharge Instructions (Signed)
You were seen for sinus pressure and cough and are being treated for a viral sinus infection.   Use antihistamines, such as cetirizine (Zyrtec) or loratadine (Claritin), to help with the drainage.  Over-the-counter Flonase 2 times a day will also help with sinus pressure.  Treat the pain with over-the-counter Tylenol and ibuprofen as needed.  Monitor your symptoms to make sure that they get better.  If your symptoms do not get better in a week since they started, or they get worse after a week, you may need to get reevaluated.   On the medications above a pregnancy safe.  You can also add Benadryl at nighttime and dextromethorphan (Robitussin) for your cough.  If your symptoms do not get better by Saturday, 10/04/2020, there will be a prescription for amoxicillin at your local pharmacy.  If you start the antibiotic, make sure you finish it completely.  Take care, Dr. Sharlet Salina, NP-c

## 2020-09-27 NOTE — ED Provider Notes (Signed)
[facility] - [place], [place]   Name: Maria Drake DOB: 1981/03/26 MRN: [id number] CSN: [id number] PCP: Leim Fabry, MD  Arrival date and time:  09/27/20 0804  Chief Complaint:  Cough, Sore Throat, Nasal Congestion, and Otalgia   NOTE: Prior to seeing the patient today, I have reviewed the triage nursing documentation and vital signs. Clinical staff has updated patient's PMH/PSHx, current medication list, and drug allergies/intolerances to ensure comprehensive history available to assist in medical decision making.   History:   HPI: Maria Drake is a 40 y.o. female who presents today with complaints of nasal congestion, sore throat, bilateral ear pain, and chest congestion.  Patient states the symptoms started approximately 3 days ago.  She brought her young son to the emergency room for a worsening ear infection at that time and she noticed her symptoms starting shortly afterwards.  Her son was subsequently diagnosed with RSV and is being treated at home.  She denies any fevers, but she has noticed some body aches.  Of note, patient states she has "bad sinuses" and most of her sinus infections that are deemed viral usually treated to bacterial sinus infections.  Most recent antibiotic use was October 2021.   Patient is fully vaccinated against COVID-19.  She advises was brought in by wearing a mask in public settings.  She is currently trying to get pregnant with her second child.   Past Medical History:  Diagnosis Date  . Endometrial disorder   . Endometriosis   . Environmental allergies   . Gestational diabetes   . Gestational hypertension   . Headache   . Hyperthyroidism   . PCOS (polycystic ovarian syndrome)   . Tachycardia     Past Surgical History:  Procedure Laterality Date  . CESAREAN SECTION    . CHOLECYSTECTOMY    . LAPAROSCOPIC ENDOMETRIOSIS FULGURATION    . ROBOTIC ASSISTED LAPAROSCOPIC CHOLECYSTECTOMY-MULTI SITE N/A 06/22/2018   Procedure:  ROBOTIC ASSISTED LAPAROSCOPIC CHOLECYSTECTOMY-MULTI SITE;  Surgeon: Leafy Ro, MD;  Location: [facility] ORS;  Service: General;  Laterality: N/A;  . WISDOM TOOTH EXTRACTION      Family History  Problem Relation Age of Onset  . Diabetes Mother   . Heart disease Father   . Diabetes Paternal Uncle   . Diabetes Maternal Grandfather   . Diabetes Paternal Grandfather   . Heart disease Paternal Grandfather   . Breast cancer Neg Hx   . Ovarian cancer Neg Hx     Social History   Tobacco Use  . Smoking status: Never Smoker  . Smokeless tobacco: Never Used  Vaping Use  . Vaping Use: Never used  Substance Use Topics  . Alcohol use: No  . Drug use: No    Patient Active Problem List   Diagnosis Date Noted  . History of gestational hypertension 11/07/2019  . Family history of congenital hydrocephalus 11/07/2019  . Calculus of gallbladder without cholecystitis without obstruction   . History of gestational diabetes 05/17/2018  . 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 Hypothyroidism 09/27/2017  . Endometriosis of pelvis 03/17/2016  . Rhinitis, allergic 03/17/2016  . Oligo-ovulation 03/17/2016  . Acne 03/17/2016  . PCOS (polycystic ovarian syndrome) 03/17/2016  . Anxiety 03/17/2016    Home Medications:    Current Meds  Medication Sig  . amoxicillin (AMOXIL) 875 MG tablet Take 1 tablet (875 mg total) by mouth 2 (two) times daily.  . polyethylene glycol (MIRALAX / GLYCOLAX) packet Take 17 g by mouth daily as needed.  Marland Kitchen  Prenatal Vit-Fe Fumarate-FA (PRENATAL MULTIVITAMIN) TABS tablet Take 1 tablet by mouth daily at 12 noon.    Allergies:   Decongest-aid [pseudoephedrine] and Codeine  Review of Systems (ROS): Review of Systems  Constitutional: Negative for activity change, appetite change, diaphoresis, fatigue and fever.  HENT: Positive for congestion, ear pain, postnasal drip, sinus pressure, sinus pain and sore throat. Negative for ear discharge, facial swelling, hearing loss and  sneezing.   Eyes: Negative for pain.  Respiratory: Positive for cough. Negative for shortness of breath and wheezing.   Gastrointestinal: Negative for constipation, nausea and vomiting.  Musculoskeletal: Positive for myalgias.  All other systems reviewed and are negative.    Vital Signs: Today's Vitals   09/27/20 0822 09/27/20 0823  BP: (!) 128/97   Pulse: 97   Resp: 18   Temp: 98.4 F (36.9 C)   TempSrc: Oral   SpO2: 100%   Weight:  155 lb (70.3 kg)  Height:  5\' 3"  (1.6 m)  PainSc: 5      Physical Exam: Physical Exam Vitals and nursing note reviewed.  Constitutional:      Appearance: Normal appearance.  HENT:     Right Ear: Tympanic membrane normal.     Left Ear: Tympanic membrane normal.     Nose: Nose normal.     Right Turbinates: Enlarged.     Left Turbinates: Enlarged.     Mouth/Throat:     Mouth: Mucous membranes are moist.     Pharynx: Posterior oropharyngeal erythema present.     Tonsils: No tonsillar exudate.  Eyes:     Pupils: Pupils are equal, round, and reactive to light.  Cardiovascular:     Rate and Rhythm: Normal rate and regular rhythm.     Pulses: Normal pulses.     Heart sounds: Normal heart sounds.  Pulmonary:     Effort: No accessory muscle usage or respiratory distress.     Breath sounds: Normal breath sounds. No decreased air movement. No decreased breath sounds or wheezing.  Musculoskeletal:     Cervical back: Normal range of motion.  Skin:    General: Skin is warm and dry.     Capillary Refill: Capillary refill takes less than 2 seconds.  Neurological:     Mental Status: She is alert.      Urgent Care Treatments / Results:   LABS: PLEASE NOTE: all labs that were ordered this encounter are listed, however only abnormal results are displayed. Labs Reviewed  GROUP A STREP BY PCR  SARS CORONAVIRUS 2 (TAT 6-24 HRS)  PREGNANCY, URINE    EKG: -None  RADIOLOGY: No results found.  PROCEDURES: Procedures  MEDICATIONS RECEIVED  THIS VISIT: Medications - No data to display  PERTINENT CLINICAL COURSE NOTES/UPDATES:   Initial Impression / Assessment and Plan / Urgent Care Course:  Pertinent labs & imaging results that were available during my care of the patient were personally reviewed by me and considered in my medical decision making (see lab/imaging section of note for values and interpretations).  Maria Drake is a 40 y.o. female who presents to [facility] today with complaints of sinus pressure and cough, diagnosed with viral sinusitis, and treated as such with the medications below. NP and patient reviewed discharge instructions below during visit.   Patient is well appearing overall in clinic today. She does not appear to be in any acute distress. Presenting symptoms (see HPI) and exam as documented above.   I have reviewed the follow up and  strict return precautions for any new or worsening symptoms. Patient is aware of symptoms that would be deemed urgent/emergent, and would thus require further evaluation either here or in the emergency department. At the time of discharge, she verbalized understanding and consent with the discharge plan as it was reviewed with her. All questions were fielded by provider and/or clinic staff prior to patient discharge.    Final Clinical Impressions / Urgent Care Diagnoses:   Final diagnoses:  Viral sinusitis    New Prescriptions:  [place] Controlled Substance Registry consulted? Not Applicable  Meds ordered this encounter  Medications  . amoxicillin (AMOXIL) 875 MG tablet    Sig: Take 1 tablet (875 mg total) by mouth 2 (two) times daily.    Dispense:  14 tablet    Refill:  0  . predniSONE (STERAPRED UNI-PAK 21 TAB) 10 MG (21) TBPK tablet    Sig: Take as instructed on package (60, 50, 40, 30, 20, 10)    Dispense:  1 each    Refill:  0      Discharge Instructions     You were seen for sinus pressure and cough and are being treated for a viral sinus  infection.   Use antihistamines, such as cetirizine (Zyrtec) or loratadine (Claritin), to help with the drainage.  Over-the-counter Flonase 2 times a day will also help with sinus pressure.  Treat the pain with over-the-counter Tylenol and ibuprofen as needed.  Monitor your symptoms to make sure that they get better.  If your symptoms do not get better in a week since they started, or they get worse after a week, you may need to get reevaluated.   On the medications above a pregnancy safe.  You can also add Benadryl at nighttime and dextromethorphan (Robitussin) for your cough.  If your symptoms do not get better by Saturday, 10/04/2020, there will be a prescription for amoxicillin at your local pharmacy.  If you start the antibiotic, make sure you finish it completely.  Take care, Dr. Sharlet Salina, NP-c     Recommended Follow up Care:  Patient encouraged to follow up with the following provider within the specified time frame, or sooner as dictated by the severity of her symptoms. As always, she was instructed that for any urgent/emergent care needs, she should seek care either here or in the emergency department for more immediate evaluation.   Bailey Mech, DNP, NP-c    Bailey Mech, NP 09/27/20 [phone]

## 2020-09-27 NOTE — ED Triage Notes (Signed)
Patient in today c/o cough, nasal congestion, sore throat and bilateral ear pain (R>L). Patient denies fever. Patient has taken OTC Mucinex, Flonase, Zyrtec and Zicam. Patient has had the covid vaccines, no booster. Patient had covid 07/2019.

## 2020-09-28 LAB — SARS CORONAVIRUS 2 (TAT 6-24 HRS): SARS Coronavirus 2: NEGATIVE

## 2020-11-11 ENCOUNTER — Encounter: Payer: BC Managed Care – PPO | Admitting: Obstetrics and Gynecology

## 2021-02-28 ENCOUNTER — Ambulatory Visit
Admission: EM | Admit: 2021-02-28 | Discharge: 2021-02-28 | Disposition: A | Discharge status: Home / Self Care | Payer: BC Managed Care – PPO | Attending: Emergency Medicine | Admitting: Emergency Medicine

## 2021-02-28 ENCOUNTER — Encounter: Payer: Self-pay | Admitting: Emergency Medicine

## 2021-02-28 ENCOUNTER — Other Ambulatory Visit: Payer: Self-pay

## 2021-02-28 DIAGNOSIS — U071 COVID-19: Secondary | ICD-10-CM | POA: Insufficient documentation

## 2021-02-28 MED ORDER — NIRMATRELVIR/RITONAVIR (PAXLOVID)TABLET: 3.0000 | ORAL_TABLET | Freq: Two times a day (BID) | ORAL | 0 refills | Status: DC

## 2021-02-28 MED ORDER — NIRMATRELVIR/RITONAVIR (PAXLOVID)TABLET: 3.0000 | ORAL_TABLET | Freq: Two times a day (BID) | ORAL | 0 refills | Status: AC

## 2021-02-28 NOTE — ED Triage Notes (Signed)
Pt c/o headache, cough, post nasal drainage, nasal congestion, runny nose. Started about 3 days ago. She states she did a home test yesterday and was positive.

## 2021-02-28 NOTE — ED Provider Notes (Signed)
[facility] [place]   Name: Maria Drake DOB: 1981/03/05 MRN: [id number] CSN: [id number] PCP: Leim Fabry, MD  Arrival date and time:  02/28/21 0160  Chief Complaint:  Covid Positive and Cough   NOTE: Prior to seeing the patient today, I have reviewed the triage nursing documentation and vital signs. Clinical staff has updated patient's PMH/PSHx, current medication list, and drug allergies/intolerances to ensure comprehensive history available to assist in medical decision making.   History:   HPI: Maria Drake is a 40 y.o. female who presents today with complaints of a postitive at home COVID-19 test.   COVID-19 assessment Symptoms: Body aches, coughing, sneezing Symptom onset: 02/26/2021 Vaccination status: Fully vaccinated not boosted Positive exposure: Yes; husband is currently in isolation for COVID-19 Social precautions: Wears mask in public Employment risk: Moderate.  Patient works in [employer]. Household: Lives with husband who is currently positive and 88-year-old son Previous COVID-19 test result: Positive home test today Symptom management: Over-the-counter NyQuil and Mucinex    Past Medical History:  Diagnosis Date   Endometrial disorder    Endometriosis    Environmental allergies    Gestational diabetes    Gestational hypertension    Headache    Hyperthyroidism    PCOS (polycystic ovarian syndrome)    Tachycardia     Past Surgical History:  Procedure Laterality Date   CESAREAN SECTION     CHOLECYSTECTOMY     LAPAROSCOPIC ENDOMETRIOSIS FULGURATION     ROBOTIC ASSISTED LAPAROSCOPIC CHOLECYSTECTOMY-MULTI SITE N/A 06/22/2018   Procedure: ROBOTIC ASSISTED LAPAROSCOPIC CHOLECYSTECTOMY-MULTI SITE;  Surgeon: Leafy Ro, MD;  Location: [facility] ORS;  Service: General;  Laterality: N/A;   WISDOM TOOTH EXTRACTION      Family History  Problem Relation Age of Onset   Diabetes Mother    Heart disease Father    Diabetes Paternal  Uncle    Diabetes Maternal Grandfather    Diabetes Paternal Grandfather    Heart disease Paternal Grandfather    Breast cancer Neg Hx    Ovarian cancer Neg Hx     Social History   Tobacco Use   Smoking status: Never   Smokeless tobacco: Never  Vaping Use   Vaping Use: Never used  Substance Use Topics   Alcohol use: No   Drug use: No    Patient Active Problem List   Diagnosis Date Noted   History of gestational hypertension 11/07/2019   Family history of congenital hydrocephalus 11/07/2019   Calculus of gallbladder without cholecystitis without obstruction    History of gestational diabetes 05/17/2018   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   Prenatal Vit-Fe Fumarate-FA (PRENATAL MULTIVITAMIN) TABS tablet Take 1 tablet by mouth daily at 12 noon.    Allergies:   Decongest-aid [pseudoephedrine] and Codeine  Review of Systems (ROS): Review of Systems  Constitutional:  Positive for fatigue. Negative for activity change, appetite change, chills and diaphoresis.  HENT:  Positive for postnasal drip and sore throat. Negative for congestion, sinus pain and sneezing.   Respiratory:  Positive for cough. Negative for shortness of breath and wheezing.   Cardiovascular:  Negative for chest pain.  Gastrointestinal:  Negative for diarrhea, nausea and vomiting.  Musculoskeletal:  Positive for myalgias.  All other systems reviewed and are negative.   Vital Signs: Today's Vitals   02/28/21 0835 02/28/21 0836 02/28/21 0840  BP:   123/90  Pulse:   (!) 103  Resp:   18  Temp:   98.4 F (36.9 C)  TempSrc:   Oral  SpO2:   98%  Weight:  154 lb 15.7 oz (70.3 kg)    Height:  5\' 3"  (1.6 m)   PainSc: 0-No pain      Physical Exam: Physical Exam Vitals and nursing note reviewed.  Constitutional:      Appearance: Normal appearance.  HENT:     Nose: Nose normal.     Mouth/Throat:     Mouth: Mucous membranes are moist.     Pharynx: No posterior oropharyngeal erythema.  Eyes:     Pupils: Pupils are equal, round, and reactive to light.  Cardiovascular:     Rate and Rhythm: Normal rate and regular rhythm.     Pulses: Normal pulses.     Heart sounds: Normal heart sounds.  Pulmonary:     Effort: No accessory muscle usage or respiratory distress.     Breath sounds: Normal breath sounds. No decreased air movement. No wheezing.  Skin:    General: Skin is warm and dry.     Capillary Refill: Capillary refill takes less than 2 seconds.  Neurological:     Mental Status: She is alert.     Urgent Care Treatments / Results:   LABS: PLEASE NOTE: all labs that were ordered this encounter are listed, however only abnormal results are displayed. Labs Reviewed  SARS CORONAVIRUS 2 (TAT 6-24 HRS)    EKG: -None  RADIOLOGY: No results found.  PROCEDURES: Procedures  MEDICATIONS RECEIVED THIS VISIT: Medications - No data to display  PERTINENT CLINICAL COURSE NOTES/UPDATES:   Initial Impression / Assessment and Plan / Urgent Care Course:  Pertinent labs & imaging results that were available during my care of the patient were personally reviewed by me and considered in my medical decision making (see lab/imaging section of note for values and interpretations).  Maria Drake is a 40 y.o. female who presents to [facility] today with complaints of positive at-home COVID-19 test, diagnosed with COVID-19, and treated as such with the medications below. NP and patient reviewed discharge instructions below during visit.   Patient is well appearing overall in clinic today. She does not appear to be in any acute distress. Presenting symptoms (see  HPI) and exam as documented above.   I have reviewed the follow up and strict return precautions for any new or worsening symptoms. Patient is aware of symptoms that would be deemed urgent/emergent, and would thus require further evaluation either here or in the emergency department. At the time of discharge, she verbalized understanding and consent with the discharge plan as it was reviewed with her. All questions were fielded by provider and/or clinic staff prior to patient discharge.    Final Clinical Impressions / Urgent Care Diagnoses:   Final diagnoses:  None    New Prescriptions:  [place] Controlled Substance Registry consulted? Not Applicable  No orders of the defined types were placed in this encounter.   Discharge Instructions   None     Recommended Follow up Care:  Patient encouraged to follow up with the following provider within the specified time frame, or sooner as dictated by the severity of her symptoms. As  always, she was instructed that for any urgent/emergent care needs, she should seek care either here or in the emergency department for more immediate evaluation.   Bailey Mech, DNP, NP-c   Bailey Mech, NP 02/28/21 [phone]

## 2021-02-28 NOTE — Discharge Instructions (Addendum)
You were seen for COVID-19 and are being treated for the same.   - You are being given a prescription for Paxlovid. Follow the directions on the medication.  - You were tested for COVID-19. If you know you were exposed to a confirmed case of COVID-19, continue quarantine until your test results are available.  Interact as little as possible until COVID-19 test results are available.  Continue to keep her social distance of 6 feet from others, wash hands frequently and wear facemask when indoors or when you are unable to social distance in outdoor settings.  If you develop any symptoms, reach out to the urgent care for further instructions.  If your test results are positive, a member of the urgent care team will reach out to you with further instructions. If you have any further questions, please don't hesitate to reach out to the urgent care clinic.  Over the counter medications that will help your symptoms: Zyrtec or Xyzal (postnasal drip, sneezing), Tylenol/Ibuprofen (fever and body aches).  Please sign up for MyChart to access your lab results.   Take care, Dr. Sharlet Salina, NP-c

## 2021-03-01 LAB — SARS CORONAVIRUS 2 (TAT 6-24 HRS): SARS Coronavirus 2: POSITIVE — AB

## 2021-03-12 ENCOUNTER — Encounter: Payer: BC Managed Care – PPO | Admitting: Obstetrics and Gynecology

## 2021-03-13 ENCOUNTER — Encounter: Payer: BC Managed Care – PPO | Admitting: Obstetrics and Gynecology

## 2021-04-09 ENCOUNTER — Other Ambulatory Visit: Payer: Self-pay

## 2021-04-09 ENCOUNTER — Telehealth: Payer: Self-pay | Admitting: Obstetrics and Gynecology

## 2021-04-09 ENCOUNTER — Ambulatory Visit
Admission: EM | Admit: 2021-04-09 | Discharge: 2021-04-09 | Disposition: A | Discharge status: Home / Self Care | Payer: BC Managed Care – PPO | Attending: Emergency Medicine | Admitting: Emergency Medicine

## 2021-04-09 DIAGNOSIS — J0110 Acute frontal sinusitis, unspecified: Secondary | ICD-10-CM

## 2021-04-09 MED ORDER — BENZONATATE 100 MG PO CAPS: 200.0000 mg | ORAL_CAPSULE | Freq: Three times a day (TID) | ORAL | 0 refills | Status: DC

## 2021-04-09 MED ORDER — PROMETHAZINE-DM 6.25-15 MG/5ML PO SYRP: 5.0000 mL | ORAL_SOLUTION | Freq: Four times a day (QID) | ORAL | 0 refills | Status: DC | PRN

## 2021-04-09 MED ORDER — IPRATROPIUM BROMIDE 0.06 % NA SOLN: 2.0000 | Freq: Four times a day (QID) | NASAL | 12 refills | Status: DC

## 2021-04-09 MED ORDER — AMOXICILLIN-POT CLAVULANATE 875-125 MG PO TABS: 1.0000 | ORAL_TABLET | Freq: Two times a day (BID) | ORAL | 0 refills | Status: AC

## 2021-04-09 NOTE — Telephone Encounter (Signed)
Maria Drake called in and states that her and her husband are trying to conceive.  Patient states she is not sure if she is currently pregnant because it is too early to tell.  Patient went to ED because she is sick and was prescribed 4 medications and she wants to discuss them with a nurse to make sure it is ok for her to take just in case she is pregnant. Please advise.

## 2021-04-09 NOTE — Discharge Instructions (Addendum)
The Augmentin twice daily with food for 10 days for treatment of your sinusitis.  Perform sinus irrigation 2-3 times a day with a [employer] sinus rinse kit and distilled water.  Do not use tap water.  You can use plain over-the-counter Mucinex every 6 hours to break up the stickiness of the mucus so your body can clear it.  Increase your oral fluid intake to thin out your mucus so that is also able for your body to clear more easily.  Take an over-the-counter probiotic, such as Culturelle-align-activia, 1 hour after each dose of antibiotic to prevent diarrhea.  If you develop any new or worsening symptoms return for reevaluation or see your primary care provider.

## 2021-04-09 NOTE — ED Provider Notes (Signed)
[facility]    CSN: [id number] Arrival date & time: 04/09/21  1244      History   Chief Complaint Chief Complaint  Patient presents with   Cough   Nasal Congestion    HPI SAVAHANNA ALMENDARIZ is a 40 y.o. female.   HPI  41 year old female here for evaluation of respiratory complaints.  Patient reports that she is felt like she had mucus collecting in her throat for several weeks and then for the past 2 to 3 days she has been experiencing a productive cough, frontal headache, maxillary sinus pain, scant nasal discharge, and sneezing.  She has been taking Mucinex, Zicam, and Atrovent nasal spray at home which have been helping but have not relieved her symptoms.  She denies fever, ringing in her ears, dizziness, discharge from ears, shortness breath or wheezing, or GI complaints.  She is having some left ear pain.  Patient reports that she and her husband are actively trying to conceive.  Last period was 03/27/2021.  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   Hypothyroidism 09/27/2017   Endometriosis of pelvis 03/17/2016   Rhinitis, allergic 03/17/2016   Oligo-ovulation 03/17/2016   Acne 03/17/2016   PCOS (polycystic ovarian syndrome) 03/17/2016   Anxiety 03/17/2016    Past Surgical History:  Procedure Laterality Date   CESAREAN SECTION     CHOLECYSTECTOMY     LAPAROSCOPIC ENDOMETRIOSIS FULGURATION     ROBOTIC ASSISTED LAPAROSCOPIC CHOLECYSTECTOMY-MULTI  SITE N/A 06/22/2018   Procedure: ROBOTIC ASSISTED LAPAROSCOPIC CHOLECYSTECTOMY-MULTI SITE;  Surgeon: Jules Husbands, MD;  Location: [facility] ORS;  Service: General;  Laterality: N/A;   WISDOM TOOTH EXTRACTION      OB History     Gravida  1   Para  1   Term  1   Preterm      AB      Living  1      SAB      IAB      Ectopic      Multiple      Live Births  1            Home Medications    Prior to Admission medications   Medication Sig Start Date End Date Taking? Authorizing Provider  amoxicillin-clavulanate (AUGMENTIN) 875-125 MG tablet Take 1 tablet by mouth every 12 (twelve) hours for 10 days. 04/09/21 04/19/21 Yes Margarette Canada, NP  benzonatate (TESSALON) 100 MG capsule Take 2 capsules (200 mg total) by mouth every 8 (eight) hours. 04/09/21  Yes Margarette Canada, NP  ipratropium (ATROVENT) 0.06 % nasal spray Place 2 sprays into both nostrils 4 (four) times daily. 04/09/21  Yes Margarette Canada, NP  Prenatal Vit-Fe Fumarate-FA (PRENATAL MULTIVITAMIN) TABS tablet Take 1 tablet by mouth daily at 12 noon.   Yes [provider]  promethazine-dextromethorphan (PROMETHAZINE-DM) 6.25-15 MG/5ML syrup Take 5 mLs by mouth 4 (four) times daily as needed. 04/09/21  Yes Margarette Canada, NP  acetaminophen (TYLENOL) 500  MG tablet Take 500 mg by mouth every 6 (six) hours as needed for moderate pain.     [provider]  cetirizine (ZYRTEC) 10 MG tablet Take 10 mg by mouth daily.    [provider]  ibuprofen (ADVIL,MOTRIN) 200 MG tablet Take 200 mg by mouth every 6 (six) hours as needed.    [provider]  polyethylene glycol (MIRALAX / GLYCOLAX) packet Take 17 g by mouth daily as needed.    [provider]  fluticasone (FLONASE) 50 MCG/ACT nasal spray Place 2 sprays into the nose daily as needed for allergies.  09/03/15 06/01/20  [provider]  Norethindrone-Ethinyl Estradiol-Fe Biphas (LO LOESTRIN FE) 1 MG-10 MCG / 10 MCG tablet Take 1 tablet by  mouth daily. 11/20/19 05/11/20  Rubie Maid, MD  NORLYDA 0.35 MG tablet TAKE 1 TABLET(0.35 MG) BY MOUTH DAILY 10/15/19 05/11/20  Rubie Maid, MD    Family History Family History  Problem Relation Age of Onset   Diabetes Mother    Heart disease Father    Diabetes Paternal Uncle    Diabetes Maternal Grandfather    Diabetes Paternal Grandfather    Heart disease Paternal Grandfather    Breast cancer Neg Hx    Ovarian cancer Neg Hx     Social History Social History   Tobacco Use   Smoking status: Never   Smokeless tobacco: Never  Vaping Use   Vaping Use: Never used  Substance Use Topics   Alcohol use: No   Drug use: No     Allergies   Decongest-aid [pseudoephedrine] and Codeine   Review of Systems Review of Systems  Constitutional:  Negative for activity change, appetite change and fever.  HENT:  Positive for congestion, ear pain, postnasal drip, rhinorrhea, sinus pain and sneezing.   Respiratory:  Positive for cough. Negative for shortness of breath and wheezing.   Gastrointestinal:  Negative for diarrhea, nausea and vomiting.  Skin:  Negative for rash.  Neurological:  Positive for headaches. Negative for dizziness.  Hematological: Negative.   Psychiatric/Behavioral: Negative.      Physical Exam Triage Vital Signs ED Triage Vitals  Enc Vitals Group     BP 04/09/21 1304 (!) 132/98     Pulse Rate 04/09/21 1304 (!) 109     Resp 04/09/21 1304 18     Temp 04/09/21 1304 98.8 F (37.1 C)     Temp Source 04/09/21 1304 Oral     SpO2 04/09/21 1304 100 %     Weight 04/09/21 1302 155 lb (70.3 kg)     Height 04/09/21 1302 '5\' 3"'  (1.6 m)     Head Circumference --      Peak Flow --      Pain Score 04/09/21 1301 4     Pain Loc --      Pain Edu? --      Excl. in [place]? --    No data found.  Updated Vital Signs BP (!) 132/98 (BP Location: Left Arm)   Pulse (!) 109   Temp 98.8 F (37.1 C) (Oral)   Resp 18   Ht '5\' 3"'  (1.6 m)   Wt 155 lb (70.3 kg)   LMP 03/27/2021    SpO2 100%   BMI 27.46 kg/m   Visual Acuity Right Eye Distance:   Left Eye Distance:   Bilateral Distance:    Right Eye Near:   Left Eye Near:    Bilateral Near:     Physical Exam Vitals and nursing  note reviewed.  Constitutional:      General: She is not in acute distress.    Appearance: Normal appearance. She is not ill-appearing.  HENT:     Head: Normocephalic and atraumatic.     Right Ear: Tympanic membrane, ear canal and external ear normal. There is no impacted cerumen.     Left Ear: Tympanic membrane, ear canal and external ear normal. There is no impacted cerumen.     Nose: Congestion and rhinorrhea present.     Mouth/Throat:     Mouth: Mucous membranes are moist.     Pharynx: Oropharynx is clear. No posterior oropharyngeal erythema.  Cardiovascular:     Rate and Rhythm: Normal rate and regular rhythm.     Pulses: Normal pulses.     Heart sounds: Normal heart sounds. No murmur heard.   No gallop.  Pulmonary:     Effort: Pulmonary effort is normal.     Breath sounds: Normal breath sounds. No wheezing, rhonchi or rales.  Musculoskeletal:     Cervical back: Normal range of motion and neck supple.  Lymphadenopathy:     Cervical: No cervical adenopathy.  Skin:    General: Skin is warm and dry.     Capillary Refill: Capillary refill takes less than 2 seconds.     Findings: No erythema or rash.  Neurological:     General: No focal deficit present.     Mental Status: She is alert and oriented to person, place, and time.  Psychiatric:        Mood and Affect: Mood normal.        Behavior: Behavior normal.        Thought Content: Thought content normal.        Judgment: Judgment normal.     UC Treatments / Results  Labs (all labs ordered are listed, but only abnormal results are displayed) Labs Reviewed - No data to display  EKG   Radiology No results found.  Procedures Procedures (including critical care time)  Medications Ordered in UC Medications -  No data to display  Initial Impression / Assessment and Plan / UC Course  I have reviewed the triage vital signs and the nursing notes.  Pertinent labs & imaging results that were available during my care of the patient were reviewed by me and considered in my medical decision making (see chart for details).  Patient is a nontoxic-appearing 40 year old female here for evaluation of respiratory complaints as outlined in HPI above.  Patient recently had COVID at the end of August, which was 3 weeks ago at this point.  She states her symptoms were mild at that time and she felt she is completely recovered but she has been experiencing some mucus collecting in her throat.  She had been attributing this to wearing a mask.  Her symptoms worsened 2 to 3 days ago with the development of a productive cough, frontal headache, maxillary sinus pain, nasal discharge, and sneezing.  She is also ear pain.  Patient's physical exam reveals pearly gray tympanic membranes bilaterally with a normal light reflex and clear external auditory canals.  Nasal mucosa is markedly erythematous and edematous with purulent nasal discharge in both nares.  Patient does have tenderness to frontal sinuses and to a lesser degree maxillary sinuses bilaterally.  Oropharyngeal exam is benign.  No cervical lymphadenopathy appreciated on exam.  Cardiopulmonary exam reveals clear lung sounds in all fields.  Suspect patient's cough is being driven by possible sinus drainage even though  there was no drainage visible in the posterior oropharynx.  Even though symptoms have been of short duration I am concerned that there is residual inflammation from the COVID infection 3 weeks prior.  We will do a trial of Augmentin twice daily x10 days to see if there is improvement in symptoms.  Patient will continue her regimen nasal spray, add in sinus irrigation, continue her over-the-counter Mucinex, and will give Tessalon Perles and Promethazine DM cough syrup  for cough.  Patient is concerned about the medication as she and her husband are actively trying to conceive.  Her last period was on 03/27/2021.  Advised patient that if she was pregnant she is through early for [place] to be able to detect diagnostically.  Her choices are to continue to try and modulate her symptoms using over-the-counter measures and stop using the Atrovent nasal spray as is contraindicated in pregnancy or she can utilize the treatment offered and abstain from unprotected sex until after she has completed her treatment.  Patient states that she will proceed with the treatment offered.   Final Clinical Impressions(s) / UC Diagnoses   Final diagnoses:  Acute non-recurrent frontal sinusitis     Discharge Instructions      The Augmentin twice daily with food for 10 days for treatment of your sinusitis.  Perform sinus irrigation 2-3 times a day with a [employer] sinus rinse kit and distilled water.  Do not use tap water.  You can use plain over-the-counter Mucinex every 6 hours to break up the stickiness of the mucus so your body can clear it.  Increase your oral fluid intake to thin out your mucus so that is also able for your body to clear more easily.  Take an over-the-counter probiotic, such as Culturelle-align-activia, 1 hour after each dose of antibiotic to prevent diarrhea.  If you develop any new or worsening symptoms return for reevaluation or see your primary care provider.      ED Prescriptions     Medication Sig Dispense Auth. Provider   amoxicillin-clavulanate (AUGMENTIN) 875-125 MG tablet Take 1 tablet by mouth every 12 (twelve) hours for 10 days. 20 tablet Margarette Canada, NP   ipratropium (ATROVENT) 0.06 % nasal spray Place 2 sprays into both nostrils 4 (four) times daily. 15 mL Margarette Canada, NP   benzonatate (TESSALON) 100 MG capsule Take 2 capsules (200 mg total) by mouth every 8 (eight) hours. 21 capsule Margarette Canada, NP   promethazine-dextromethorphan  (PROMETHAZINE-DM) 6.25-15 MG/5ML syrup Take 5 mLs by mouth 4 (four) times daily as needed. 118 mL Margarette Canada, NP      PDMP not reviewed this encounter.   Margarette Canada, NP 04/09/21 1340

## 2021-04-09 NOTE — Telephone Encounter (Signed)
Spoke to pt concerning her call to the office. Pt was seen at the ED and was given medication for sinus issues. Pt stated that she asked the ED provider if the medication was safe to take if she was pregnant and he was unable to answer the question. Pt given rx for the following medication and would like if they are safe to take. amoxicillin-clavulanate (AUGMENTIN) 875-125 MG tablet Take 1 tablet by mouth every 12 (twelve) hours for 10 days. 20 tablet Becky Augusta, NP     ipratropium (ATROVENT) 0.06 % nasal spray Place 2 sprays into both nostrils 4 (four) times daily. 15 mL Becky Augusta, NP    benzonatate (TESSALON) 100 MG capsule Take 2 capsules (200 mg total) by mouth every 8 (eight) hours. 21 capsule Becky Augusta, NP    promethazine-dextromethorphan (PROMETHAZINE-DM) 6.25-15 MG/5ML syrup Take 5 mLs by mouth 4 (four) times daily as needed. 118 mL Becky Augusta, NP

## 2021-04-09 NOTE — Telephone Encounter (Signed)
Please advise. Thanks Gustave Lindeman 

## 2021-04-09 NOTE — ED Triage Notes (Signed)
Pt c/o cough and chest congestion off and on for a couple of weeks. Pt also reports headache, facial pain and some ear pain. Pt denies f/n/v/d or other symptoms. Pt did have COVID in August, took an at-home COVID and it was negative.

## 2021-04-10 NOTE — Telephone Encounter (Signed)
Pt called and is aware that the medication prescribed by the ED doctor is safe to take. Geraldo Pitter

## 2021-07-07 ENCOUNTER — Encounter: Payer: BC Managed Care – PPO | Admitting: Obstetrics and Gynecology

## 2021-08-28 NOTE — Progress Notes (Deleted)
GYNECOLOGY ANNUAL PHYSICAL EXAM PROGRESS NOTE  Subjective:    Maria Drake is a 41 y.o. G62P1001 female who presents for an annual exam. The patient has no complaints today. The patient is sexually active. The patient wears seatbelts: yes. The patient participates in regular exercise: no. Has the patient ever been transfused or tattooed?: no.    Menstrual History: Menarche age: *** No LMP recorded.     Gynecologic History:  Contraception: none History of STI's:  Last Pap: 09/27/2017. Results were: normal. Denies h/o abnormal pap smears. Last mammogram: Not age appropriate   OB History  Gravida Para Term Preterm AB Living  1 1 1  0 0 1  SAB IAB Ectopic Multiple Live Births  0 0 0 0 1    # Outcome Date GA Lbr Len/2nd Weight Sex Delivery Anes PTL Lv  1 Term 03/27/18 [redacted]w[redacted]d  8 lb 1.6 oz (3.675 kg) M CS-LTranv Spinal N LIV     Birth Comments: fetal hydrocephalus     Complications: Hydrocephalus, Gestational hypertension, Gestational diabetes     Apgar1: 9  Apgar5: 9    Past Medical History:  Diagnosis Date   Endometrial disorder    Endometriosis    Environmental allergies    Gestational diabetes    Gestational hypertension    Headache    Hyperthyroidism    PCOS (polycystic ovarian syndrome)    Tachycardia     Past Surgical History:  Procedure Laterality Date   CESAREAN SECTION     CHOLECYSTECTOMY     LAPAROSCOPIC ENDOMETRIOSIS FULGURATION     ROBOTIC ASSISTED LAPAROSCOPIC CHOLECYSTECTOMY-MULTI SITE N/A 06/22/2018   Procedure: ROBOTIC ASSISTED LAPAROSCOPIC CHOLECYSTECTOMY-MULTI SITE;  Surgeon: Jules Husbands, MD;  Location: [facility] ORS;  Service: General;  Laterality: N/A;   WISDOM TOOTH EXTRACTION      Family History  Problem Relation Age of Onset   Diabetes Mother    Heart disease Father    Diabetes Paternal Uncle    Diabetes Maternal Grandfather    Diabetes Paternal Grandfather    Heart disease Paternal Grandfather    Breast cancer Neg Hx    Ovarian  cancer Neg Hx     Social History   Socioeconomic History   Marital status: Married    Spouse name: Not on file   Number of children: Not on file   Years of education: Not on file   Highest education level: Not on file  Occupational History   Not on file  Tobacco Use   Smoking status: Never   Smokeless tobacco: Never  Vaping Use   Vaping Use: Never used  Substance and Sexual Activity   Alcohol use: No   Drug use: No   Sexual activity: Yes    Birth control/protection: None  Other Topics Concern   Not on file  Social History Narrative   Not on file   Social Determinants of Health   Financial Resource Strain: Not on file  Food Insecurity: Not on file  Transportation Needs: Not on file  Physical Activity: Not on file  Stress: Not on file  Social Connections: Not on file  Intimate Partner Violence: Not on file    Current Outpatient Medications on File Prior to Visit  Medication Sig Dispense Refill   acetaminophen (TYLENOL) 500 MG tablet Take 500 mg by mouth every 6 (six) hours as needed for moderate pain.      benzonatate (TESSALON) 100 MG capsule Take 2 capsules (200 mg total) by mouth every 8 (eight)  hours. 21 capsule 0   cetirizine (ZYRTEC) 10 MG tablet Take 10 mg by mouth daily.     ibuprofen (ADVIL,MOTRIN) 200 MG tablet Take 200 mg by mouth every 6 (six) hours as needed.     ipratropium (ATROVENT) 0.06 % nasal spray Place 2 sprays into both nostrils 4 (four) times daily. 15 mL 12   polyethylene glycol (MIRALAX / GLYCOLAX) packet Take 17 g by mouth daily as needed.     Prenatal Vit-Fe Fumarate-FA (PRENATAL MULTIVITAMIN) TABS tablet Take 1 tablet by mouth daily at 12 noon.     promethazine-dextromethorphan (PROMETHAZINE-DM) 6.25-15 MG/5ML syrup Take 5 mLs by mouth 4 (four) times daily as needed. 118 mL 0   [DISCONTINUED] fluticasone (FLONASE) 50 MCG/ACT nasal spray Place 2 sprays into the nose daily as needed for allergies.      [DISCONTINUED] Norethindrone-Ethinyl  Estradiol-Fe Biphas (LO LOESTRIN FE) 1 MG-10 MCG / 10 MCG tablet Take 1 tablet by mouth daily. 3 Package 3   [DISCONTINUED] NORLYDA 0.35 MG tablet TAKE 1 TABLET(0.35 MG) BY MOUTH DAILY 28 tablet 0   No current facility-administered medications on file prior to visit.    Allergies  Allergen Reactions   Decongest-Aid [Pseudoephedrine] Anaphylaxis    tachycardia   Codeine Nausea And Vomiting     Review of Systems Constitutional: negative for chills, fatigue, fevers and sweats Eyes: negative for irritation, redness and visual disturbance Ears, nose, mouth, throat, and face: negative for hearing loss, nasal congestion, snoring and tinnitus Respiratory: negative for asthma, cough, sputum Cardiovascular: negative for chest pain, dyspnea, exertional chest pressure/discomfort, irregular heart beat, palpitations and syncope Gastrointestinal: negative for abdominal pain, change in bowel habits, nausea and vomiting Genitourinary: negative for abnormal menstrual periods, genital lesions, sexual problems and vaginal discharge, dysuria and urinary incontinence Integument/breast: negative for breast lump, breast tenderness and nipple discharge Hematologic/lymphatic: negative for bleeding and easy bruising Musculoskeletal:negative for back pain and muscle weakness Neurological: negative for dizziness, headaches, vertigo and weakness Endocrine: negative for diabetic symptoms including polydipsia, polyuria and skin dryness Allergic/Immunologic: negative for hay fever and urticaria      Objective:  There were no vitals taken for this visit. There is no height or weight on file to calculate BMI.    General Appearance:    Alert, cooperative, no distress, appears stated age  Head:    Normocephalic, without obvious abnormality, atraumatic  Eyes:    PERRL, conjunctiva/corneas clear, EOM's intact, both eyes  Ears:    Normal external ear canals, both ears  Nose:   Nares normal, septum midline, mucosa  normal, no drainage or sinus tenderness  Throat:   Lips, mucosa, and tongue normal; teeth and gums normal  Neck:   Supple, symmetrical, trachea midline, no adenopathy; thyroid: no enlargement/tenderness/nodules; no carotid bruit or JVD  Back:     Symmetric, no curvature, ROM normal, no CVA tenderness  Lungs:     Clear to auscultation bilaterally, respirations unlabored  Chest Wall:    No tenderness or deformity   Heart:    Regular rate and rhythm, S1 and S2 normal, no murmur, rub or gallop  Breast Exam:    No tenderness, masses, or nipple abnormality  Abdomen:     Soft, non-tender, bowel sounds active all four quadrants, no masses, no organomegaly.    Genitalia:    Pelvic:external genitalia normal, vagina without lesions, discharge, or tenderness, rectovaginal septum  normal. Cervix normal in appearance, no cervical motion tenderness, no adnexal masses or tenderness.  Uterus normal size, shape,  mobile, regular contours, nontender.  Rectal:    Normal external sphincter.  No hemorrhoids appreciated. Internal exam not done.   Extremities:   Extremities normal, atraumatic, no cyanosis or edema  Pulses:   2+ and symmetric all extremities  Skin:   Skin color, texture, turgor normal, no rashes or lesions  Lymph nodes:   Cervical, supraclavicular, and axillary nodes normal  Neurologic:   CNII-XII intact, normal strength, sensation and reflexes throughout   .  Labs:  Lab Results  Component Value Date   WBC 7.6 11/06/2019   HGB 14.0 11/06/2019   HCT 41.8 11/06/2019   MCV 87 11/06/2019   PLT 230 11/06/2019    Lab Results  Component Value Date   CREATININE 0.73 11/06/2019   BUN 11 11/06/2019   NA 139 11/06/2019   K 3.7 11/06/2019   CL 102 11/06/2019   CO2 23 11/06/2019    Lab Results  Component Value Date   ALT 22 11/06/2019   AST 21 11/06/2019   ALKPHOS 76 11/06/2019   BILITOT 0.4 11/06/2019    Lab Results  Component Value Date   TSH 2.290 11/06/2019     Assessment:   No  diagnosis found.   Plan:  Blood tests: {blood tests:13147}. Breast self exam technique reviewed and patient encouraged to perform self-exam monthly. Contraception: none. Discussed healthy lifestyle modifications. Mammogram  Not age appropriate Pap smear ordered. COVID vaccination status: Follow up in 1 year for annual exam  Rubie Maid, MD [facility]

## 2021-08-28 NOTE — Patient Instructions (Signed)
Breast Self-Awareness °Breast self-awareness is knowing how your breasts look and feel. Doing breast self-awareness is important. It allows you to catch a breast problem early while it is still small and can be treated. All women should do breast self-awareness, including women who have had breast implants. Tell your doctor if you notice a change in your breasts. °What you need: °A mirror. °A well-lit room. °How to do a breast self-exam °A breast self-exam is one way to learn what is normal for your breasts and to check for changes. To do a breast self-exam: °Look for changes ° °Take off all the clothes above your waist. °Stand in front of a mirror in a room with good lighting. °Put your hands on your hips. °Push your hands down. °Look at your breasts and nipples in the mirror to see if one breast or nipple looks different from the other. Check to see if: °The shape of one breast is different. °The size of one breast is different. °There are wrinkles, dips, and bumps in one breast and not the other. °Look at each breast for changes in the skin, such as: °Redness. °Scaly areas. °Look for changes in your nipples, such as: °Liquid around the nipples. °Bleeding. °Dimpling. °Redness. °A change in where the nipples are. °Feel for changes ° °Lie on your back on the floor. °Feel each breast. To do this, follow these steps: °Pick a breast to feel. °Put the arm closest to that breast above your head. °Use your other arm to feel the nipple area of your breast. Feel the area with the pads of your three middle fingers by making small circles with your fingers. For the first circle, press lightly. For the second circle, press harder. For the third circle, press even harder. °Keep making circles with your fingers at the different pressures as you move down your breast. Stop when you feel your ribs. °Move your fingers a little toward the center of your body. °Start making circles with your fingers again, this time going up until  you reach your collarbone. °Keep making up-and-down circles until you reach your armpit. Remember to keep using the three pressures. °Feel the other breast in the same way. °Sit or stand in the tub or shower. °With soapy water on your skin, feel each breast the same way you did in step 2 when you were lying on the floor. °Write down what you find °Writing down what you find can help you remember what to tell your doctor. Write down: °What is normal for each breast. °Any changes you find in each breast, including: °The kind of changes you find. °Whether you have pain. °Size and location of any lumps. °When you last had your menstrual period. °General tips °Check your breasts every month. °If you are breastfeeding, the best time to check your breasts is after you feed your baby or after you use a breast pump. °If you get menstrual periods, the best time to check your breasts is 5-7 days after your menstrual period is over. °With time, you will become comfortable with the self-exam, and you will begin to know if there are changes in your breasts. °Contact a doctor if you: °See a change in the shape or size of your breasts or nipples. °See a change in the skin of your breast or nipples, such as red or scaly skin. °Have fluid coming from your nipples that is not normal. °Find a lump or thick area that was not there before. °Have pain in   your breasts. °Have any concerns about your breast health. °Summary °Breast self-awareness includes looking for changes in your breasts, as well as feeling for changes within your breasts. °Breast self-awareness should be done in front of a mirror in a well-lit room. °You should check your breasts every month. If you get menstrual periods, the best time to check your breasts is 5-7 days after your menstrual period is over. °Let your doctor know of any changes you see in your breasts, including changes in size, changes on the skin, pain or tenderness, or fluid from your nipples that is not  normal. °This information is not intended to replace advice given to you by your health care provider. Make sure you discuss any questions you have with your health care provider. °Document Revised: 02/14/2018 Document Reviewed: 02/14/2018 °Elsevier Patient Education © 2022 [employer] °Preventive Care 21-39 Years Old, Female °Preventive care refers to lifestyle choices and visits with your health care provider that can promote health and wellness. Preventive care visits are also called wellness exams. °What can I expect for my preventive care visit? °Counseling °During your preventive care visit, your health care provider may ask about your: °Medical history, including: °Past medical problems. °Family medical history. °Pregnancy history. °Current health, including: °Menstrual cycle. °Method of birth control. °Emotional well-being. °Home life and relationship well-being. °Sexual activity and sexual health. °Lifestyle, including: °Alcohol, nicotine or tobacco, and drug use. °Access to firearms. °Diet, exercise, and sleep habits. °Work and work environment. °Sunscreen use. °Safety issues such as seatbelt and bike helmet use. °Physical exam °Your health care provider may check your: °Height and weight. These may be used to calculate your BMI (body mass index). BMI is a measurement that tells if you are at a healthy weight. °Waist circumference. This measures the distance around your waistline. This measurement also tells if you are at a healthy weight and may help predict your risk of certain diseases, such as type 2 diabetes and high blood pressure. °Heart rate and blood pressure. °Body temperature. °Skin for abnormal spots. °What immunizations do I need? °Vaccines are usually given at various ages, according to a schedule. Your health care provider will recommend vaccines for you based on your age, medical history, and lifestyle or other factors, such as travel or where you work. °What tests do I  need? °Screening °Your health care provider may recommend screening tests for certain conditions. This may include: °Pelvic exam and Pap test. °Lipid and cholesterol levels. °Diabetes screening. This is done by checking your blood sugar (glucose) after you have not eaten for a while (fasting). °Hepatitis B test. °Hepatitis C test. °HIV (human immunodeficiency virus) test. °STI (sexually transmitted infection) testing, if you are at risk. °BRCA-related cancer screening. This may be done if you have a family history of breast, ovarian, tubal, or peritoneal cancers. °Talk with your health care provider about your test results, treatment options, and if necessary, the need for more tests. °Follow these instructions at home: °Eating and drinking ° °Eat a healthy diet that includes fresh fruits and vegetables, whole grains, lean protein, and low-fat dairy products. °Take vitamin and mineral supplements as recommended by your health care provider. °Do not drink alcohol if: °Your health care provider tells you not to drink. °You are pregnant, may be pregnant, or are planning to become pregnant. °If you drink alcohol: °Limit how much you have to 0-1 drink a day. °Know how much alcohol is in your drink. In the [place] one drink equals one 12 oz   bottle of beer (355 mL), one 5 oz glass of wine (148 mL), or one 1 oz glass of hard liquor (44 mL). Lifestyle Brush your teeth every morning and night with fluoride toothpaste. Floss one time each day. Exercise for at least 30 minutes 5 or more days each week. Do not use any products that contain nicotine or tobacco. These products include cigarettes, chewing tobacco, and vaping devices, such as e-cigarettes. If you need help quitting, ask your health care provider. Do not use drugs. If you are sexually active, practice safe sex. Use a condom or other form of protection to prevent STIs. If you do not wish to become pregnant, use a form of birth control. If you plan to become  pregnant, see your health care provider for a prepregnancy visit. Find healthy ways to manage stress, such as: Meditation, yoga, or listening to music. Journaling. Talking to a trusted person. Spending time with friends and family. Minimize exposure to UV radiation to reduce your risk of skin cancer. Safety Always wear your seat belt while driving or riding in a vehicle. Do not drive: If you have been drinking alcohol. Do not ride with someone who has been drinking. If you have been using any mind-altering substances or drugs. While texting. When you are tired or distracted. Wear a helmet and other protective equipment during sports activities. If you have firearms in your house, make sure you follow all gun safety procedures. Seek help if you have been physically or sexually abused. What's next? Go to your health care provider once a year for an annual wellness visit. Ask your health care provider how often you should have your eyes and teeth checked. Stay up to date on all vaccines. This information is not intended to replace advice given to you by your health care provider. Make sure you discuss any questions you have with your health care provider. Document Revised: 12/24/2020 Document Reviewed: 12/24/2020 Else[employer] Patient Education  [place].

## 2021-09-02 ENCOUNTER — Other Ambulatory Visit: Payer: Self-pay

## 2021-09-02 ENCOUNTER — Encounter: Payer: Self-pay | Admitting: Obstetrics and Gynecology

## 2021-09-02 ENCOUNTER — Ambulatory Visit: Payer: BC Managed Care – PPO | Admitting: Obstetrics and Gynecology

## 2021-09-02 DIAGNOSIS — Z124 Encounter for screening for malignant neoplasm of cervix: Secondary | ICD-10-CM

## 2021-09-02 DIAGNOSIS — Z01419 Encounter for gynecological examination (general) (routine) without abnormal findings: Secondary | ICD-10-CM

## 2021-09-03 DIAGNOSIS — K8020 Calculus of gallbladder without cholecystitis without obstruction: Secondary | ICD-10-CM

## 2021-10-27 ENCOUNTER — Encounter: Payer: BC Managed Care – PPO | Admitting: Obstetrics and Gynecology

## 2021-12-16 NOTE — Patient Instructions (Signed)
Breast Self-Awareness Breast self-awareness is knowing how your breasts look and feel. You need to: Check your breasts on a regular basis. Tell your doctor about any changes. Become familiar with the look and feel of your breasts. This can help you catch a breast problem while it is still small and can be treated. You should do breast self-exams even if you have breast implants. What you need: A mirror. A well-lit room. A pillow or other soft object. How to do a breast self-exam Follow these steps to do a breast self-exam: Look for changes  Take off all the clothes above your waist. Stand in front of a mirror in a room with good lighting. Put your hands down at your sides. Compare your breasts in the mirror. Look for any difference between them, such as: A difference in shape. A difference in size. Wrinkles, dips, and bumps in one breast and not the other. Look at each breast for changes in the skin, such as: Redness. Scaly areas. Skin that has gotten thicker. Dimpling. Open sores (ulcers). Look for changes in your nipples, such as: Fluid coming out of a nipple. Fluid around a nipple. Bleeding. Dimpling. Redness. A nipple that looks pushed in (retracted), or that has changed position. Feel for changes Lie on your back. Feel each breast. To do this: Pick a breast to feel. Place a pillow under the shoulder closest to that breast. Put the arm closest to that breast behind your head. Feel the nipple area of that breast using the hand of your other arm. Feel the area with the pads of your three middle fingers by making small circles with your fingers. Use light, medium, and firm pressure. Continue the overlapping circles, moving downward over the breast. Keep making circles with your fingers. Stop when you feel your ribs. Start making circles with your fingers again, this time going upward until you reach your collarbone. Then, make circles outward across your breast and into your  armpit area. Squeeze your nipple. Check for discharge and lumps. Repeat these steps to check your other breast. Sit or stand in the tub or shower. With soapy water on your skin, feel each breast the same way you did when you were lying down. Write down what you find Writing down what you find can help you remember what to tell your doctor. Write down: What is normal for each breast. Any changes you find in each breast. These include: The kind of changes you find. A tender or painful breast. Any lump you find. Write down its size and where it is. When you last had your monthly period (menstrual cycle). General tips If you are breastfeeding, the best time to check your breasts is after you feed your baby or after you use a breast pump. If you get monthly bleeding, the best time to check your breasts is 5-7 days after your monthly cycle ends. With time, you will become comfortable with the self-exam. You will also start to know if there are changes in your breasts. Contact a doctor if: You see a change in the shape or size of your breasts or nipples. You see a change in the skin of your breast or nipples, such as red or scaly skin. You have fluid coming from your nipples that is not normal. You find a new lump or thick area. You have breast pain. You have any concerns about your breast health. Summary Breast self-awareness includes looking for changes in your breasts and feeling for changes   within your breasts. You should do breast self-awareness in front of a mirror in a well-lit room. If you get monthly periods (menstrual cycles), the best time to check your breasts is 5-7 days after your period ends. Tell your doctor about any changes you see in your breasts. Changes include changes in size, changes on the skin, painful or tender breasts, or fluid from your nipples that is not normal. This information is not intended to replace advice given to you by your health care provider. Make sure  you discuss any questions you have with your health care provider. Document Revised: 04/30/2021 Document Reviewed: 04/30/2021 Elsevier Patient Education  2023 [employer] Preventive Care 21-39 Years Old, Female Preventive care refers to lifestyle choices and visits with your health care provider that can promote health and wellness. Preventive care visits are also called wellness exams. What can I expect for my preventive care visit? Counseling During your preventive care visit, your health care provider may ask about your: Medical history, including: Past medical problems. Family medical history. Pregnancy history. Current health, including: Menstrual cycle. Method of birth control. Emotional well-being. Home life and relationship well-being. Sexual activity and sexual health. Lifestyle, including: Alcohol, nicotine or tobacco, and drug use. Access to firearms. Diet, exercise, and sleep habits. Work and work environment. Sunscreen use. Safety issues such as seatbelt and bike helmet use. Physical exam Your health care provider may check your: Height and weight. These may be used to calculate your BMI (body mass index). BMI is a measurement that tells if you are at a healthy weight. Waist circumference. This measures the distance around your waistline. This measurement also tells if you are at a healthy weight and may help predict your risk of certain diseases, such as type 2 diabetes and high blood pressure. Heart rate and blood pressure. Body temperature. Skin for abnormal spots. What immunizations do I need?  Vaccines are usually given at various ages, according to a schedule. Your health care provider will recommend vaccines for you based on your age, medical history, and lifestyle or other factors, such as travel or where you work. What tests do I need? Screening Your health care provider may recommend screening tests for certain conditions. This may include: Pelvic exam  and Pap test. Lipid and cholesterol levels. Diabetes screening. This is done by checking your blood sugar (glucose) after you have not eaten for a while (fasting). Hepatitis B test. Hepatitis C test. HIV (human immunodeficiency virus) test. STI (sexually transmitted infection) testing, if you are at risk. BRCA-related cancer screening. This may be done if you have a family history of breast, ovarian, tubal, or peritoneal cancers. Talk with your health care provider about your test results, treatment options, and if necessary, the need for more tests. Follow these instructions at home: Eating and drinking  Eat a healthy diet that includes fresh fruits and vegetables, whole grains, lean protein, and low-fat dairy products. Take vitamin and mineral supplements as recommended by your health care provider. Do not drink alcohol if: Your health care provider tells you not to drink. You are pregnant, may be pregnant, or are planning to become pregnant. If you drink alcohol: Limit how much you have to 0-1 drink a day. Know how much alcohol is in your drink. In the [place] one drink equals one 12 oz bottle of beer (355 mL), one 5 oz glass of wine (148 mL), or one 1 oz glass of hard liquor (44 mL). Lifestyle Brush your teeth every morning and   night with fluoride toothpaste. Floss one time each day. Exercise for at least 30 minutes 5 or more days each week. Do not use any products that contain nicotine or tobacco. These products include cigarettes, chewing tobacco, and vaping devices, such as e-cigarettes. If you need help quitting, ask your health care provider. Do not use drugs. If you are sexually active, practice safe sex. Use a condom or other form of protection to prevent STIs. If you do not wish to become pregnant, use a form of birth control. If you plan to become pregnant, see your health care provider for a prepregnancy visit. Find healthy ways to manage stress, such as: Meditation, yoga, or  listening to music. Journaling. Talking to a trusted person. Spending time with friends and family. Minimize exposure to UV radiation to reduce your risk of skin cancer. Safety Always wear your seat belt while driving or riding in a vehicle. Do not drive: If you have been drinking alcohol. Do not ride with someone who has been drinking. If you have been using any mind-altering substances or drugs. While texting. When you are tired or distracted. Wear a helmet and other protective equipment during sports activities. If you have firearms in your house, make sure you follow all gun safety procedures. Seek help if you have been physically or sexually abused. What's next? Go to your health care provider once a year for an annual wellness visit. Ask your health care provider how often you should have your eyes and teeth checked. Stay up to date on all vaccines. This information is not intended to replace advice given to you by your health care provider. Make sure you discuss any questions you have with your health care provider. Document Revised: 12/24/2020 Document Reviewed: 12/24/2020 Elsevier Patient Education  2023 [employer]  

## 2021-12-16 NOTE — Progress Notes (Signed)
GYNECOLOGY ANNUAL PHYSICAL EXAM PROGRESS NOTE  Subjective:    Maria Drake is a 41 y.o. [id number] female who presents for an annual exam. he patient is sexually active. The patient participates in regular exercise: no. Has the patient ever been transfused or tattooed?: no. The patient reports that there is not domestic violence in her life.   The patient has the following concerns today:  Sometimes has twinges of pain in her breasts. Has not breastfeed in almost a year.  History of infertility and PCOS, desiring to conceive, has been trying to conceive for almost 1.5 years. Required REI management with first child.  Notes her cycles are more regular now than they were in the past. History of hydrocephalus in prior pregnancy, also very nervous about this recurring.    Menstrual History: Menarche age: 89 Patient's last menstrual period was 12/05/2021. Period Duration (Days): 3-4 Period Pattern: Regular Menstrual Flow: Moderate Menstrual Control: Panty liner, Maxi pad Menstrual Control Change Freq (Hours): 6-8 Dysmenorrhea: (!) Mild Dysmenorrhea Symptoms: Cramping, Headache   Gynecologic History:  Contraception: none History of STI's:  Last Pap: 09/27/2017. Results were: normal.  Denies h/o abnormal pap smears.     12/17/2021   1418  Pregnancy Intention Screening   Does the patient want to become pregnant in the next year? Yes  Does the patient's partner want to become pregnant in the next year? Yes  Does the patient currently take folic acid or women's MVI, or a prenatal viitamin? Yes  Does the patient or their partner want to learn more about planning a healthy pregnancy? No  Would the patient like to discuss contraceptive options today? No  Contraception Wrap Up   Current Method Pregnant/Seeking Pregnancy  End Method Pregnant/Seeking Pregnancy  Contraception Counseling Provided No    The pregnancy intention screening data noted above was reviewed. Potential methods  of contraception were discussed. The patient elected to proceed with Pregnant/Seeking Pregnancy.   OB History  Gravida Para Term Preterm AB Living  1 1 1  0 0 1  SAB IAB Ectopic Multiple Live Births  0 0 0 0 1    # Outcome Date GA Lbr Len/2nd Weight Sex Delivery Anes PTL Lv  1 Term 03/27/18 2630w3d  8 lb 1.6 oz (3.675 kg) M CS-LTranv Spinal N LIV     Birth Comments: fetal hydrocephalus     Complications: Hydrocephalus, Gestational hypertension, Gestational diabetes     Apgar1: 9  Apgar5: 9    Past Medical History:  Diagnosis Date   Endometrial disorder    Endometriosis    Environmental allergies    Gestational diabetes    Gestational hypertension    Headache    Hyperthyroidism    PCOS (polycystic ovarian syndrome)    Tachycardia     Past Surgical History:  Procedure Laterality Date   CESAREAN SECTION     CHOLECYSTECTOMY     LAPAROSCOPIC ENDOMETRIOSIS FULGURATION     ROBOTIC ASSISTED LAPAROSCOPIC CHOLECYSTECTOMY-MULTI SITE N/A 06/22/2018   Procedure: ROBOTIC ASSISTED LAPAROSCOPIC CHOLECYSTECTOMY-MULTI SITE;  Surgeon: Leafy RoPabon, Diego F, MD;  Location: [facility] ORS;  Service: General;  Laterality: N/A;   WISDOM TOOTH EXTRACTION      Family History  Problem Relation Age of Onset   Diabetes Mother    Heart disease Father    Diabetes Paternal Uncle    Diabetes Maternal Grandfather    Diabetes Paternal Grandfather    Heart disease Paternal Grandfather    Breast cancer Neg Hx    Ovarian  cancer Neg Hx     Social History   Socioeconomic History   Marital status: Married    Spouse name: Not on file   Number of children: Not on file   Years of education: Not on file   Highest education level: Not on file  Occupational History   Not on file  Tobacco Use   Smoking status: Never   Smokeless tobacco: Never  Vaping Use   Vaping Use: Never used  Substance and Sexual Activity   Alcohol use: No   Drug use: No   Sexual activity: Yes    Birth control/protection: None  Other  Topics Concern   Not on file  Social History Narrative   Not on file   Social Determinants of Health   Financial Resource Strain: Not on file  Food Insecurity: Not on file  Transportation Needs: Not on file  Physical Activity: Not on file  Stress: Not on file  Social Connections: Not on file  Intimate Partner Violence: Not on file    Current Outpatient Medications on File Prior to Visit  Medication Sig Dispense Refill   acetaminophen (TYLENOL) 500 MG tablet Take 500 mg by mouth every 6 (six) hours as needed for moderate pain.      cetirizine (ZYRTEC) 10 MG tablet Take 10 mg by mouth daily.     ibuprofen (ADVIL,MOTRIN) 200 MG tablet Take 200 mg by mouth every 6 (six) hours as needed.     ipratropium (ATROVENT) 0.06 % nasal spray Place 2 sprays into both nostrils 4 (four) times daily. 15 mL 12   polyethylene glycol (MIRALAX / GLYCOLAX) packet Take 17 g by mouth daily as needed.     Prenatal Vit-Fe Fumarate-FA (PRENATAL MULTIVITAMIN) TABS tablet Take 1 tablet by mouth daily at 12 noon.     promethazine-dextromethorphan (PROMETHAZINE-DM) 6.25-15 MG/5ML syrup Take 5 mLs by mouth 4 (four) times daily as needed. 118 mL 0   [DISCONTINUED] fluticasone (FLONASE) 50 MCG/ACT nasal spray Place 2 sprays into the nose daily as needed for allergies.      [DISCONTINUED] Norethindrone-Ethinyl Estradiol-Fe Biphas (LO LOESTRIN FE) 1 MG-10 MCG / 10 MCG tablet Take 1 tablet by mouth daily. 3 Package 3   [DISCONTINUED] NORLYDA 0.35 MG tablet TAKE 1 TABLET(0.35 MG) BY MOUTH DAILY 28 tablet 0   No current facility-administered medications on file prior to visit.    Allergies  Allergen Reactions   Decongest-Aid [Pseudoephedrine] Anaphylaxis    tachycardia   Codeine Nausea And Vomiting     Review of Systems Constitutional: negative for chills, fatigue, fevers and sweats Eyes: negative for irritation, redness and visual disturbance Ears, nose, mouth, throat, and face: negative for hearing loss, nasal  congestion, snoring and tinnitus Respiratory: negative for asthma, cough, sputum Cardiovascular: negative for chest pain, dyspnea, exertional chest pressure/discomfort, irregular heart beat, palpitations and syncope Gastrointestinal: negative for abdominal pain, change in bowel habits, nausea and vomiting Genitourinary: negative for abnormal menstrual periods, genital lesions, sexual problems and vaginal discharge, dysuria and urinary incontinence Integument/breast: negative for breast lump, breast tenderness and nipple discharge Hematologic/lymphatic: negative for bleeding and easy bruising Musculoskeletal:negative for back pain and muscle weakness Neurological: negative for dizziness, headaches, vertigo and weakness Endocrine: negative for diabetic symptoms including polydipsia, polyuria and skin dryness Allergic/Immunologic: negative for hay fever and urticaria      Objective:  Blood pressure 110/70, pulse (!) 101, resp. rate 16, height 5\' 3"  (1.6 m), weight 174 lb 11.2 oz (79.2 kg), last menstrual period 12/05/2021.  Body mass index is 30.95 kg/m.    General Appearance:    Alert, cooperative, no distress, appears stated age  Head:    Normocephalic, without obvious abnormality, atraumatic  Eyes:    PERRL, conjunctiva/corneas clear, EOM's intact, both eyes  Ears:    Normal external ear canals, both ears  Nose:   Nares normal, septum midline, mucosa normal, no drainage or sinus tenderness  Throat:   Lips, mucosa, and tongue normal; teeth and gums normal  Neck:   Supple, symmetrical, trachea midline, no adenopathy; thyroid: no enlargement/tenderness/nodules; no carotid bruit or JVD  Back:     Symmetric, no curvature, ROM normal, no CVA tenderness  Lungs:     Clear to auscultation bilaterally, respirations unlabored  Chest Wall:    No tenderness or deformity   Heart:    Regular rate and rhythm, S1 and S2 normal, no murmur, rub or gallop  Breast Exam:    No tenderness, masses, or nipple  abnormality  Abdomen:     Soft, non-tender, bowel sounds active all four quadrants, no masses, no organomegaly.    Genitalia:    Pelvic:external genitalia normal, vagina without lesions, discharge, or tenderness, rectovaginal septum  normal. Cervix normal in appearance, no cervical motion tenderness, no adnexal masses or tenderness.  Uterus normal size, shape, mobile, regular contours, nontender.  Rectal:    Normal external sphincter.  No hemorrhoids appreciated. Internal exam not done.   Extremities:   Extremities normal, atraumatic, no cyanosis or edema  Pulses:   2+ and symmetric all extremities  Skin:   Skin color, texture, turgor normal, no rashes or lesions  Lymph nodes:   Cervical, supraclavicular, and axillary nodes normal  Neurologic:   CNII-XII intact, normal strength, sensation and reflexes throughout   .  Labs:  Lab Results  Component Value Date   WBC 7.6 11/06/2019   HGB 14.0 11/06/2019   HCT 41.8 11/06/2019   MCV 87 11/06/2019   PLT 230 11/06/2019    Lab Results  Component Value Date   CREATININE 0.73 11/06/2019   BUN 11 11/06/2019   NA 139 11/06/2019   K 3.7 11/06/2019   CL 102 11/06/2019   CO2 23 11/06/2019    Lab Results  Component Value Date   ALT 22 11/06/2019   AST 21 11/06/2019   ALKPHOS 76 11/06/2019   BILITOT 0.4 11/06/2019    Lab Results  Component Value Date   TSH 2.290 11/06/2019     Assessment:   1. Encounter for well woman exam with routine gynecological exam   2. PCOS (polycystic ovarian syndrome)   3. Hypothyroidism, unspecified type   4. Cervical cancer screening   5. Family history of congenital hydrocephalus   6. History of gestational hypertension   7. History of gestational diabetes      Plan:  - Blood tests: CBC with diff, Comprehensive metabolic panel, Lipoproteins, TSH, HgbA1c, and PCOS profile. - Breast self exam technique reviewed and patient encouraged to perform self-exam monthly. - Contraception: OCP  (estrogen/progesterone). - Discussed healthy lifestyle modifications. - Mammogram  : Not age appropriate - Pap smear ordered. - COVID vaccination status: Has completed Pfizer vaccination series. Eligible for booster.  - Lengthy discussion had with patient regarding current conception attempts, previous obstetric history. Discussed potential risks or developing HTN or DM in subsequent pregnancy. Reassured that likelihood of recurrence of hydrocephalus was extremely rare. Also discussed concerns of AMA status and risks associated. Patient required ovulation induction by REI due to PCOS and  infertility with last pregnancy. Advised that she could resume same medications (except trigger injections) at current office. Patient will make decision after labs resulted.  - Follow up in 1 year for annual exam   A total of 40 minutes were spent face-to-face with the patient during the encounter and over half of that time involved counseling and coordination of care.   Hildred Laser, MD [facility]

## 2021-12-17 ENCOUNTER — Ambulatory Visit (INDEPENDENT_AMBULATORY_CARE_PROVIDER_SITE_OTHER): Payer: BC Managed Care – PPO | Admitting: Obstetrics and Gynecology

## 2021-12-17 ENCOUNTER — Other Ambulatory Visit (HOSPITAL_COMMUNITY)
Admission: RE | Admit: 2021-12-17 | Discharge: 2021-12-17 | Disposition: A | Discharge status: Home / Self Care | Payer: BC Managed Care – PPO | Source: Ambulatory Visit | Attending: Obstetrics and Gynecology | Admitting: Obstetrics and Gynecology

## 2021-12-17 ENCOUNTER — Encounter: Payer: Self-pay | Admitting: Obstetrics and Gynecology

## 2021-12-17 VITALS — BP 110/70 | HR 101 | Resp 16 | Ht 63.0 in | Wt 174.7 lb

## 2021-12-17 DIAGNOSIS — Z01419 Encounter for gynecological examination (general) (routine) without abnormal findings: Secondary | ICD-10-CM | POA: Diagnosis not present

## 2021-12-17 DIAGNOSIS — Z124 Encounter for screening for malignant neoplasm of cervix: Secondary | ICD-10-CM | POA: Insufficient documentation

## 2021-12-17 DIAGNOSIS — Z8742 Personal history of other diseases of the female genital tract: Secondary | ICD-10-CM

## 2021-12-17 DIAGNOSIS — E282 Polycystic ovarian syndrome: Secondary | ICD-10-CM

## 2021-12-17 DIAGNOSIS — Z8279 Family history of other congenital malformations, deformations and chromosomal abnormalities: Secondary | ICD-10-CM

## 2021-12-17 DIAGNOSIS — E039 Hypothyroidism, unspecified: Secondary | ICD-10-CM

## 2021-12-17 DIAGNOSIS — Z8759 Personal history of other complications of pregnancy, childbirth and the puerperium: Secondary | ICD-10-CM

## 2021-12-17 DIAGNOSIS — Z8632 Personal history of gestational diabetes: Secondary | ICD-10-CM

## 2021-12-21 LAB — INSULIN, RANDOM: INSULIN: 39.2 u[IU]/mL — ABNORMAL HIGH (ref 2.6–24.9)

## 2021-12-21 LAB — CBC
Hematocrit: 39 % (ref 34.0–46.6)
Hemoglobin: 13.2 g/dL (ref 11.1–15.9)
MCH: 29.3 pg (ref 26.6–33.0)
MCHC: 33.8 g/dL (ref 31.5–35.7)
MCV: 87 fL (ref 79–97)
Platelets: 253 10*3/uL (ref 150–450)
RBC: 4.5 x10E6/uL (ref 3.77–5.28)
RDW: 12.7 % (ref 11.7–15.4)
WBC: 11.1 10*3/uL — ABNORMAL HIGH (ref 3.4–10.8)

## 2021-12-21 LAB — THYROID PANEL WITH TSH
Free Thyroxine Index: 2 (ref 1.2–4.9)
T3 Uptake Ratio: 25 % (ref 24–39)
T4, Total: 7.8 ug/dL (ref 4.5–12.0)
TSH: 2.07 u[IU]/mL (ref 0.450–4.500)

## 2021-12-21 LAB — COMPREHENSIVE METABOLIC PANEL
ALT: 27 IU/L (ref 0–32)
AST: 19 IU/L (ref 0–40)
Albumin/Globulin Ratio: 1.6 (ref 1.2–2.2)
Albumin: 4.4 g/dL (ref 3.8–4.8)
Alkaline Phosphatase: 66 IU/L (ref 44–121)
BUN/Creatinine Ratio: 11 (ref 9–23)
BUN: 10 mg/dL (ref 6–20)
Bilirubin Total: 0.3 mg/dL (ref 0.0–1.2)
CO2: 25 mmol/L (ref 20–29)
Calcium: 9.4 mg/dL (ref 8.7–10.2)
Chloride: 103 mmol/L (ref 96–106)
Creatinine, Ser: 0.89 mg/dL (ref 0.57–1.00)
Globulin, Total: 2.8 g/dL (ref 1.5–4.5)
Glucose: 88 mg/dL (ref 70–99)
Potassium: 3.7 mmol/L (ref 3.5–5.2)
Sodium: 141 mmol/L (ref 134–144)
Total Protein: 7.2 g/dL (ref 6.0–8.5)
eGFR: 85 mL/min/{1.73_m2} (ref >59)

## 2021-12-21 LAB — ANTI MULLERIAN HORMONE: ANTI-MULLERIAN HORMONE (AMH): 1.84 ng/mL

## 2021-12-21 LAB — PROLACTIN: Prolactin: 17.4 ng/mL (ref 4.8–23.3)

## 2021-12-21 LAB — HEMOGLOBIN A1C
Est. average glucose Bld gHb Est-mCnc: 120 mg/dL
Hgb A1c MFr Bld: 5.8 % — ABNORMAL HIGH (ref 4.8–5.6)

## 2021-12-21 LAB — LIPID PANEL
Chol/HDL Ratio: 4.8 ratio — ABNORMAL HIGH (ref 0.0–4.4)
Cholesterol, Total: 222 mg/dL — ABNORMAL HIGH (ref 100–199)
HDL: 46 mg/dL (ref >39)
LDL Chol Calc (NIH): 143 mg/dL — ABNORMAL HIGH (ref 0–99)
Triglycerides: 185 mg/dL — ABNORMAL HIGH (ref 0–149)
VLDL Cholesterol Cal: 33 mg/dL (ref 5–40)

## 2021-12-21 LAB — ESTRADIOL: Estradiol: 137 pg/mL

## 2021-12-21 LAB — FSH/LH
FSH: 4.6 m[IU]/mL
LH: 5.7 m[IU]/mL

## 2021-12-21 LAB — TESTOSTERONE, FREE, TOTAL, SHBG
Sex Hormone Binding: 42.1 nmol/L (ref 24.6–122.0)
Testosterone, Free: 1.1 pg/mL (ref 0.0–4.2)
Testosterone: 15 ng/dL (ref 8–60)

## 2021-12-21 LAB — DHEA-SULFATE: DHEA-SO4: 89.2 ug/dL (ref 57.3–279.2)

## 2021-12-24 LAB — CYTOLOGY - PAP
Comment: NEGATIVE
Diagnosis: UNDETERMINED — AB
High risk HPV: NEGATIVE

## 2021-12-25 ENCOUNTER — Encounter: Payer: Self-pay | Admitting: Obstetrics and Gynecology

## 2021-12-25 ENCOUNTER — Other Ambulatory Visit: Payer: Self-pay

## 2021-12-25 DIAGNOSIS — R7303 Prediabetes: Secondary | ICD-10-CM

## 2021-12-25 NOTE — Progress Notes (Signed)
Maria Drake,  Dr. Valentino Saxon is out of the office and will not return until June 28. I can answer some of your questions. In regards to your pap smear she said:    Your pap smear shows some mildly abnormal cells, however your HPV (the virus that causes cervical cancer) is negative, so your overall risk is still low. These cell changes will likely resolve without intervention. Continue routine every 3 year screens.  I have placed a referral to Lifestyles or Nutrition and Diabetes services at [facility].  As far as your questions about the Femara, I am unable to provide answers for that. Dr. Valentino Saxon will respond to your questions about the Femara as soon as she returns.  Thanks  Maria Drake, CMA

## 2022-01-15 ENCOUNTER — Encounter: Payer: Self-pay | Admitting: Obstetrics and Gynecology

## 2022-01-15 MED ORDER — LETROZOLE 2.5 MG PO TABS: ORAL_TABLET | ORAL | 1 refills | Status: DC

## 2022-01-15 MED ORDER — METFORMIN HCL 500 MG PO TABS: ORAL_TABLET | ORAL | 5 refills | Status: DC

## 2022-03-11 ENCOUNTER — Encounter: Payer: Self-pay | Admitting: Dietician

## 2022-03-11 ENCOUNTER — Encounter: Payer: BC Managed Care – PPO | Attending: Obstetrics and Gynecology | Admitting: Dietician

## 2022-03-11 VITALS — Ht 63.0 in | Wt 176.9 lb

## 2022-03-11 DIAGNOSIS — E282 Polycystic ovarian syndrome: Secondary | ICD-10-CM | POA: Diagnosis not present

## 2022-03-11 DIAGNOSIS — Z8632 Personal history of gestational diabetes: Secondary | ICD-10-CM

## 2022-03-11 DIAGNOSIS — R7303 Prediabetes: Secondary | ICD-10-CM

## 2022-03-11 NOTE — Progress Notes (Signed)
Medical Nutrition Therapy: Visit start time: 0910  end time: 1020  Assessment:   Referral Diagnosis: prediabetes Other medical history/ diagnoses: PCOS,  Psychosocial issues/ stress concerns: none  Medications, supplements: reconciled list in medical record   Preferred learning method:  Auditory   Current weight: 176.9lbs Height: 5'3" BMI: 31.34 Patient's personal weight goal: 140lbs  Progress and evaluation:  Recent (12/17/21) HbA1C 5.8%, total cholesterol 222, HDL 46, LDL 143, triglycerides 185  Patient is trying for another pregnancy, she states need for cholesterol medication will be addressed after pregnancy. She is making some diet and lifestyle changes to improve blood sugars and lipids -- fewer sweets, lean protein foods, low fat cooking methods Food allergies: none Special diet practices: no; avoids fish due to dislike Patient seeks help with reducing diabetes risk, better overall nutrition    Dietary Intake:  Usual eating pattern includes 3 meals and 0-1 snacks per day. Dining out frequency: 2-4 meals per week. Who plans meals/ buys groceries? self Who prepares meals? self  Breakfast: usually whole grain toast; oatmeal; french toast sticks with sf syrup; fruit; bacon and eggs; muffins Snack: none Lunch: leftovers; chicken tender/ nuggets kids meal; salad with grilled chicken balsamic dressing Snack: occasionally crackers  Supper: chicken baked or sauteed/ grilled / 93% lean ground beef on weekends + 1-2 veg ie green beans/ lima beans/ salad/ raw carrots + macaroni/ bread/ rice Snack: occasionally popcorn/ cheese-its/ occ ice cream; tries to limit sweets Beverages: water, juice in am sometimes, sodas  Physical activity: some running in place often when doing chores; occasional walking -- sporadic   Intervention:   Nutrition Care Education:   Basic nutrition: basic food groups; appropriate nutrient balance; appropriate meal and snack schedule; general nutrition  guidelines    Weight control: importance of low sugar and low fat choices; portion control and appropriate food portions; estimated energy needs for weight loss at 1400-1500 kcal, provided guidance for 45% CHO, 25% pro, 30% fat prediabetes:  appropriate meal and snack schedule; appropriate carb intake and balance, healthy carb choices + limiting sugar in beverages; role of fiber, protein, fat; physical activity Hyperlipidemia:  healthy and unhealthy fats; role of fiber, plant foods (sterols); role of exercise  Other intervention notes: Patient has begun making positive lifestyle changes, and is motivated to continue. Established goals for additional changes with input from patient   Nutritional Diagnosis:  [place]-2.2 Altered nutrition-related laboratory As related to prediabetes, hyperlipidemia.  As evidenced by elevated HbA1C, elevated total cholesterol, LDL, and triglycerides. [place]-3.3 Overweight/obesity As related to excess calories, inadequate physical activity.  As evidenced by patient with current BMI of 31.34.   Education Materials given:  General diet guidelines for Diabetes (prevention) Plate Planner with food lists, sample meal pattern Sample menus Snacking handout Exercise packet (ADA) Visit summary with goals/ instructions   Learner/ who was taught:  Patient   Level of understanding: Verbalizes/ demonstrates competency  Demonstrated degree of understanding via:   Teach back Learning barriers: None  Willingness to learn/ readiness for change: Eager, change in progress  Monitoring and Evaluation:  Dietary intake, exercise, BG control, blood lipids, and body weight      follow up:  04/29/22 at 8:15am

## 2022-03-11 NOTE — Patient Instructions (Signed)
Control the amount of carb with each meal and snack; keep to 30-45g with meals and 15-30g with snacks. Plan to include at least 1 veg and/ or fruit with each meal, and with some snacks. Try apps for exercise such as FitOn.

## 2022-03-22 ENCOUNTER — Telehealth: Payer: Self-pay | Admitting: Obstetrics and Gynecology

## 2022-03-22 NOTE — Telephone Encounter (Signed)
Mara from [place] would like to speak with provider/cma in regards to patients last visit and codes and diagnosis that wasn't covered. She is needing to have it reviewed and member called for update.

## 2022-03-22 NOTE — Telephone Encounter (Signed)
Yes sorry she gave me to [phone] or [phone]

## 2022-04-05 ENCOUNTER — Telehealth: Payer: Self-pay | Admitting: Obstetrics and Gynecology

## 2022-04-05 NOTE — Telephone Encounter (Signed)
Patient called and states that she received a bill from [facility]. Patient states she contacted her insurance company inquiring about why she received a bill. Patient states that she was told by the insurance company that it depends on how it was coded. Patient would like the labs to be resent to billing for review. The date of appointment needing review is 12/17/2021.

## 2022-04-16 DIAGNOSIS — S93602A Unspecified sprain of left foot, initial encounter: Secondary | ICD-10-CM | POA: Diagnosis not present

## 2022-04-26 DIAGNOSIS — S82832A Other fracture of upper and lower end of left fibula, initial encounter for closed fracture: Secondary | ICD-10-CM | POA: Diagnosis not present

## 2022-04-26 NOTE — Telephone Encounter (Signed)
Contacted patient to inform patient the request for reviewing her bill has been sent. No answer, Left VM informing patient of this information.

## 2022-04-29 ENCOUNTER — Encounter: Payer: Self-pay | Admitting: Dietician

## 2022-04-29 ENCOUNTER — Encounter: Payer: BC Managed Care – PPO | Attending: Obstetrics and Gynecology | Admitting: Dietician

## 2022-04-29 VITALS — Ht 63.0 in | Wt 182.4 lb

## 2022-04-29 DIAGNOSIS — R7303 Prediabetes: Secondary | ICD-10-CM | POA: Diagnosis not present

## 2022-04-29 DIAGNOSIS — E282 Polycystic ovarian syndrome: Secondary | ICD-10-CM | POA: Insufficient documentation

## 2022-04-29 DIAGNOSIS — Z8632 Personal history of gestational diabetes: Secondary | ICD-10-CM | POA: Diagnosis not present

## 2022-04-29 NOTE — Progress Notes (Signed)
Medical Nutrition Therapy: Visit start time: 0820  end time: 0900  Assessment:  Diagnosis: prediabetes, PCOS Medical history changes: no changes Psychosocial issues/ stress concerns: none  Medications, supplement changes: no changes   Current weight: 182.6 with ortho boot  Height: 5'3" BMI: 32.31  Progress and evaluation:  Progress with increasing healthy food choices is gradual per patient, finding suitable options is a challenge at times. She is trying to control grocery budget She has been reducing some food portions, especially starches.     Dietary Intake:  Usual eating pattern includes 3 meals and 1 snacks per day.  Breakfast: whole grain toast; oatmeal; french toast sticks with sf syrup; fruit; bacon and eggs; 10/19 pancake with light syrup Snack: none Lunch: leftovers; chicken tender/ nuggets kids meal; salad with grilled chicken balsamic dressing Snack: protein snack pack with nuts, cheese, dried fruit Supper: baked/ grilled chicken or extra lean beef; + starch + veg Snack: keeps sweets out of the house typically Beverages: water, limits soda (reg or zero sugar) to 1 per day, occ tea or crystal light lemonade, occ juice in am  Physical activity: currently contraindicated until foot fracture heals  Intervention:   Nutrition Care Education:  Basic nutrition: reviewed appropriate nutrient balance; appropriate meal and snack schedule    Weight control: reviewed progress since previous visit Advanced nutrition:  cooking techniques; increasing vegetables and fruits while minimizing cost prediabetes:  reviewed appropriate meal and snack schedule; appropriate carb intake and balance; healthy carb choices   Other intervention notes: Weight stable despite food fracture Patient is working on positive changes and is motivated to continue Scheduled next follow up after scheduled lab tests  Nutritional Diagnosis:  [place]-2.2 Altered nutrition-related laboratory As related to  prediabetes, hyperlipidemia.  As evidenced by elevated HbA1C, elevated total cholesterol, LDL, and triglycerides. [place]-3.3 Overweight/obesity As related to excess calories, inadequate physical activity.  As evidenced by patient with current BMI of 32.31   Education Materials given:  What's in Season produce chart Cooking Healthy on a Budget Visit summary with goals/ instructions   Learner/ who was taught:  Patient    Level of understanding: Unable to understand/ needs instruction   Demonstrated degree of understanding via:   Teach back Learning barriers: None  Willingness to learn/ readiness for change: Eager, change in progress   Monitoring and Evaluation:  Dietary intake, exercise, BG control, and body weight      follow up:  08/18/22 at 8:15am

## 2022-04-29 NOTE — Patient Instructions (Signed)
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  Gradually resume physical activity when foot heals.

## 2022-05-04 DIAGNOSIS — L400 Psoriasis vulgaris: Secondary | ICD-10-CM | POA: Diagnosis not present

## 2022-05-04 DIAGNOSIS — D2262 Melanocytic nevi of left upper limb, including shoulder: Secondary | ICD-10-CM | POA: Diagnosis not present

## 2022-05-04 DIAGNOSIS — D2272 Melanocytic nevi of left lower limb, including hip: Secondary | ICD-10-CM | POA: Diagnosis not present

## 2022-05-04 DIAGNOSIS — D2261 Melanocytic nevi of right upper limb, including shoulder: Secondary | ICD-10-CM | POA: Diagnosis not present

## 2022-05-13 NOTE — Telephone Encounter (Signed)
Patient is calling to follow up on labs that cost her 700.00 dollars and was wanting to know if there was a was to recode them so insurance would cover. Could you please follow up with this patient?

## 2022-05-17 NOTE — Telephone Encounter (Signed)
Patient is aware per [facility] message has been sent to [facility]. No additional info available at this time. Advised patient to follow up with [facility].

## 2022-05-24 DIAGNOSIS — H21233 Degeneration of iris (pigmentary), bilateral: Secondary | ICD-10-CM | POA: Diagnosis not present

## 2022-05-24 DIAGNOSIS — H40003 Preglaucoma, unspecified, bilateral: Secondary | ICD-10-CM | POA: Diagnosis not present

## 2022-06-01 DIAGNOSIS — J301 Allergic rhinitis due to pollen: Secondary | ICD-10-CM | POA: Diagnosis not present

## 2022-06-01 DIAGNOSIS — J320 Chronic maxillary sinusitis: Secondary | ICD-10-CM | POA: Diagnosis not present

## 2022-06-08 ENCOUNTER — Encounter: Payer: Self-pay | Admitting: Obstetrics and Gynecology

## 2022-08-18 ENCOUNTER — Encounter: Payer: Self-pay | Admitting: Dietician

## 2022-08-18 ENCOUNTER — Encounter: Payer: BC Managed Care – PPO | Attending: Obstetrics and Gynecology | Admitting: Dietician

## 2022-08-18 VITALS — Wt 178.5 lb

## 2022-08-18 DIAGNOSIS — E282 Polycystic ovarian syndrome: Secondary | ICD-10-CM | POA: Diagnosis not present

## 2022-08-18 DIAGNOSIS — R7303 Prediabetes: Secondary | ICD-10-CM

## 2022-08-18 NOTE — Progress Notes (Signed)
Medical Nutrition Therapy: Visit start time: 0830  end time: 0910  Assessment:  Diagnosis: prediabetes, PCOS Medical history changes: no changes Psychosocial issues/ stress concerns: none  Medications, supplement changes: reconciled list in medical record   Current weight: 178.5lbs Height: 5'3" BMI: 31.62  Progress and evaluation:  Patient reports difficulty increasing physical activity due to full time work and small child at home; was wearing boot for several weeks due to ankle injury. She continues efforts to increase healthy food choices, still having some challenge in finding time to prep meals, find simple and quick meal options.     Dietary Intake:  Usual eating pattern includes 3 meals and 0-1 snacks per day. Dining out frequency:   Breakfast: whole grain toast; oatmeal; french toast sticks with sf syrup; fruit; bacon and eggs; 10/19 pancake with light syrup  Snack: none Lunch:  leftovers; chicken tender/ nuggets kids meal; salad with grilled chicken balsamic dressing  Snack: none Supper: baked/ grilled chicken or extra lean beef; + starch + veg  Snack: none Beverages: water, crystal light, sometimes tea, occasional soda diet or reg  Physical activity: trying to start; difficult due to busy schedule; occ walk/ jog around property 20-30 minutes  Intervention:   Nutrition Care Education:  Basic nutrition: reviewed appropriate nutrient balance; general nutrition guidelines    Weight control: reviewed progress since previous visit Advanced nutrition: cooking techniques-- meal prep; batch cooking, freezing foods in single portions; resources for meal/ menu ideas; healthy snack options prediabetes:  reviewed appropriate carb intake and balance; healthy carb choices for lower glycemic response; role of fiber, protein, fat; effects of stress, physical activity   Other Intervention Notes: Patient is maintaining positive diet changes made previously; additional changes are  proving difficult.  Updated goals for more manageable changes.  Patient will plan to schedule another follow up after next MD visit and lab tests if needed.   Nutritional Diagnosis:  [place]-2.2 Altered nutrition-related laboratory As related to prediabetes, hyperlipidemia.  As evidenced by elevated HbA1C, elevated total cholesterol, LDL, and triglycerides. [place]-3.3 Overweight/obesity As related to excess calories, inadequate physical activity.  As evidenced by patient with current BMI of 31.62   Education Materials given:  Lunch on the Go Carb Mindful Smoothie recipe template Build a Pyramid [employer] Snacking handout Visit summary with Goals/ instructions   Learner/ who was taught:  Patient   Level of understanding: Verbalizes/ demonstrates competency  Demonstrated degree of understanding via:   Teach back Learning barriers: None   Willingness to learn/ readiness for change: Eager, change in progress   Monitoring and Evaluation:  Dietary intake, exercise, BG control, and body weight      follow up: prn

## 2022-08-18 NOTE — Patient Instructions (Signed)
When choosing foods such as protein/ granola bars, aim for <10g sugar, mostly whole grain, and 10g+ of protein if needed as a protein source.  OK to have a protein shake/ smoothie as a meal as long as it is not high in sugar, <10g "added" sugar.  Continue to look for quick and simple meal options for lunches, and healthy snacks.

## 2022-09-07 DIAGNOSIS — U071 COVID-19: Secondary | ICD-10-CM | POA: Diagnosis not present

## 2022-12-27 DIAGNOSIS — J320 Chronic maxillary sinusitis: Secondary | ICD-10-CM | POA: Diagnosis not present

## 2022-12-27 DIAGNOSIS — J301 Allergic rhinitis due to pollen: Secondary | ICD-10-CM | POA: Diagnosis not present

## 2023-01-02 ENCOUNTER — Other Ambulatory Visit: Payer: Self-pay | Admitting: Obstetrics and Gynecology

## 2023-03-05 DIAGNOSIS — H1031 Unspecified acute conjunctivitis, right eye: Secondary | ICD-10-CM | POA: Diagnosis not present

## 2023-05-17 DIAGNOSIS — L814 Other melanin hyperpigmentation: Secondary | ICD-10-CM | POA: Diagnosis not present

## 2023-05-17 DIAGNOSIS — D2261 Melanocytic nevi of right upper limb, including shoulder: Secondary | ICD-10-CM | POA: Diagnosis not present

## 2023-05-17 DIAGNOSIS — D2262 Melanocytic nevi of left upper limb, including shoulder: Secondary | ICD-10-CM | POA: Diagnosis not present

## 2023-05-17 DIAGNOSIS — D225 Melanocytic nevi of trunk: Secondary | ICD-10-CM | POA: Diagnosis not present

## 2023-05-26 DIAGNOSIS — H21233 Degeneration of iris (pigmentary), bilateral: Secondary | ICD-10-CM | POA: Diagnosis not present

## 2023-05-27 ENCOUNTER — Other Ambulatory Visit (HOSPITAL_COMMUNITY)
Admission: RE | Admit: 2023-05-27 | Discharge: 2023-05-27 | Disposition: A | Discharge status: Home / Self Care | Payer: BC Managed Care – PPO | Source: Ambulatory Visit

## 2023-05-27 ENCOUNTER — Ambulatory Visit: Payer: BC Managed Care – PPO

## 2023-05-27 VITALS — BP 125/84 | HR 88 | Ht 63.0 in | Wt 175.7 lb

## 2023-05-27 DIAGNOSIS — R102 Pelvic and perineal pain: Secondary | ICD-10-CM | POA: Insufficient documentation

## 2023-05-27 DIAGNOSIS — Z3202 Encounter for pregnancy test, result negative: Secondary | ICD-10-CM

## 2023-05-27 LAB — POCT URINALYSIS DIPSTICK
Bilirubin, UA: NEGATIVE
Blood, UA: NEGATIVE
Glucose, UA: NEGATIVE
Ketones, UA: NEGATIVE
Leukocytes, UA: NEGATIVE
Nitrite, UA: NEGATIVE
Protein, UA: NEGATIVE
Spec Grav, UA: <=1.005 — AB (ref 1.010–1.025)
Urobilinogen, UA: 0.2 U/dL
pH, UA: 5 (ref 5.0–8.0)

## 2023-05-27 LAB — POCT URINE PREGNANCY: Preg Test, Ur: NEGATIVE

## 2023-05-27 NOTE — Progress Notes (Signed)
GYN ENCOUNTER  Encounter for pelvic pain  Subjective  HPI: Maria Drake is a 42 y.o. G1P1001 who presents today for pelvic pain that has been ongoing since Wednesday of this week.   Having pain in lower abdomen. First noticed on Wednesday. Thought it might be early menstrual cramps as she is due to start her period next week. As the day progressed, starting noticing more pressure that felt like it could be gas. On Wednesday night, kept heating pad on which helped and has had less sharp pains since that time but still has not resolved.   Has history of endometriosis identified at age 75 that she thinks is on her colon. She has pain when she has to have bowel movement during her period and was thinking that might be what's happening with this pain. Tried taking probiotics yesterday which gave her diarrhea, but still having pain.   Was previously on COCs for management of endometriosis until she tried to conceive with her first child. After first pregnancy, she started on POPs but has not taken for about 2-3 years. She is having regular periods that are generally fairly light. Has some cramping and constipation with periods, but usually only lasts one day.   Past Medical History:  Diagnosis Date   Endometrial disorder    Endometriosis    Environmental allergies    Gestational diabetes    Gestational hypertension    Headache    Hyperthyroidism    PCOS (polycystic ovarian syndrome)    Tachycardia    Past Surgical History:  Procedure Laterality Date   CESAREAN SECTION     CHOLECYSTECTOMY     LAPAROSCOPIC ENDOMETRIOSIS FULGURATION     ROBOTIC ASSISTED LAPAROSCOPIC CHOLECYSTECTOMY-MULTI SITE N/A 06/22/2018   Procedure: ROBOTIC ASSISTED LAPAROSCOPIC CHOLECYSTECTOMY-MULTI SITE;  Surgeon: Leafy Ro, MD;  Location: [facility] ORS;  Service: General;  Laterality: N/A;   WISDOM TOOTH EXTRACTION     OB History     Gravida  1   Para  1   Term  1   Preterm      AB      Living  1       SAB      IAB      Ectopic      Multiple      Live Births  1          Allergies  Allergen Reactions   Decongest-Aid [Pseudoephedrine] Anaphylaxis    tachycardia   Codeine Nausea And Vomiting    Review of Systems  12 point ROS negative except for pertinent positives noted in HPO above.   Objective  BP 125/84   Pulse 88   Ht 5\' 3"  (1.6 m)   Wt 175 lb 11.2 oz (79.7 kg)   LMP 05/02/2023 (Exact Date)   BMI 31.12 kg/m   Physical examination GENERAL APPEARANCE: alert, well appearing LUNGS: normal work of breathing HEART: normal heart rate     Pelvic exam: normal external genitalia, vulva, vagina, cervix, uterus and adnexa, VULVA: normal appearing vulva with no masses, tenderness or lesions, VAGINA: normal appearing vagina with normal color and discharge, no lesions, CERVIX: normal appearing cervix without discharge or lesions, UTERUS: uterus is normal size, shape, consistency and nontender, ADNEXA: normal adnexa in size, tenderness on palpation particularly on right side, no masses.   Assessment/Plan - UA unremarkable, UPT negative. Infection swab collected.  - Discussed possible etiologies of pelvic pain and discomfort including infection, menstrual cycle related changes, constipation, ongoing endometriosis,  and/or other uterine abnormalities. Recommended giving symptoms some time to see if they resolve on their own, particularly after upcoming menstrual cycle. Patient desires TVUS to rule out cyst or other uterine issues, order placed. - Recommended follow up if symptoms persist or worsen.   Lindalou Hose Tolulope Pinkett, CNM  05/27/23 11:49 AM

## 2023-05-27 NOTE — Patient Instructions (Signed)
Pelvic Pain, Female Pelvic pain is pain in your lower abdomen, below your belly button and between your hips. The pain may start suddenly (be acute), keep coming back (be recurring), or last a long time (become chronic). Pelvic pain that lasts longer than 6 months is considered chronic. Pelvic pain may affect your: Reproductive organs. Urinary system. Digestive tract. Musculoskeletal system. There are many potential causes of pelvic pain. Sometimes, the pain can be a result of digestive or urinary conditions, strained muscles or ligaments, or reproductive conditions. Sometimes the cause of pelvic pain is not known. Follow these instructions at home:  Take over-the-counter and prescription medicines only as told by your health care provider. Rest as told by your health care provider. Do not have sex if it hurts. Keep a journal of your pelvic pain. Write down: When the pain started. Where the pain is located. What seems to make the pain better or worse, such as food or your monthly period (menstrual cycle). Any symptoms you have along with the pain. Keep all follow-up visits. This is important. Contact a health care provider if: Medicine does not help your pain, or your pain comes back. You have new symptoms. You have abnormal vaginal discharge or bleeding, including bleeding after menopause. You have a fever or chills. You are constipated. You have blood in your urine or stool (feces). You have foul-smelling urine. You feel weak or light-headed. Get help right away if: You have sudden severe pain. Your pain gets steadily worse. You have severe pain along with fever, nausea, vomiting, or excessive sweating. You lose consciousness. These symptoms may represent a serious problem that is an emergency. Do not wait to see if the symptoms will go away. Get medical help right away. Call your local emergency services (911 in the [place] Do not drive yourself to the hospital. Summary Pelvic  pain is pain in your lower abdomen, below your belly button and between your hips. There are many potential causes of pelvic pain. Keep a journal of your pelvic pain. This information is not intended to replace advice given to you by your health care provider. Make sure you discuss any questions you have with your health care provider. Document Revised: 11/04/2020 Document Reviewed: 11/04/2020 Elsevier Patient Education  2024 [employer].

## 2023-05-30 LAB — CERVICOVAGINAL ANCILLARY ONLY
Bacterial Vaginitis (gardnerella): NEGATIVE
Candida Glabrata: NEGATIVE
Candida Vaginitis: NEGATIVE
Comment: NEGATIVE
Comment: NEGATIVE
Comment: NEGATIVE

## 2023-05-31 ENCOUNTER — Ambulatory Visit: Payer: BC Managed Care – PPO

## 2023-06-27 DIAGNOSIS — J340 Abscess, furuncle and carbuncle of nose: Secondary | ICD-10-CM | POA: Diagnosis not present

## 2023-06-27 DIAGNOSIS — J301 Allergic rhinitis due to pollen: Secondary | ICD-10-CM | POA: Diagnosis not present

## 2023-07-21 ENCOUNTER — Other Ambulatory Visit: Payer: BC Managed Care – PPO

## 2023-10-03 DIAGNOSIS — H109 Unspecified conjunctivitis: Secondary | ICD-10-CM | POA: Diagnosis not present

## 2023-10-10 DIAGNOSIS — H109 Unspecified conjunctivitis: Secondary | ICD-10-CM | POA: Diagnosis not present

## 2023-10-10 DIAGNOSIS — H1013 Acute atopic conjunctivitis, bilateral: Secondary | ICD-10-CM | POA: Diagnosis not present

## 2023-11-23 DIAGNOSIS — J301 Allergic rhinitis due to pollen: Secondary | ICD-10-CM | POA: Diagnosis not present

## 2023-11-23 DIAGNOSIS — J0100 Acute maxillary sinusitis, unspecified: Secondary | ICD-10-CM | POA: Diagnosis not present

## 2023-12-07 ENCOUNTER — Ambulatory Visit

## 2023-12-07 ENCOUNTER — Other Ambulatory Visit (HOSPITAL_COMMUNITY)
Admission: RE | Admit: 2023-12-07 | Discharge: 2023-12-07 | Disposition: A | Discharge status: Home / Self Care | Source: Ambulatory Visit

## 2023-12-07 VITALS — BP 150/92 | HR 97 | Ht 63.0 in | Wt 183.0 lb

## 2023-12-07 DIAGNOSIS — B379 Candidiasis, unspecified: Secondary | ICD-10-CM

## 2023-12-07 DIAGNOSIS — N760 Acute vaginitis: Secondary | ICD-10-CM | POA: Diagnosis not present

## 2023-12-07 DIAGNOSIS — R300 Dysuria: Secondary | ICD-10-CM | POA: Diagnosis not present

## 2023-12-07 LAB — POCT URINALYSIS DIPSTICK
Bilirubin, UA: NEGATIVE
Blood, UA: NEGATIVE
Glucose, UA: NEGATIVE
Ketones, UA: NEGATIVE
Leukocytes, UA: NEGATIVE
Nitrite, UA: NEGATIVE
Protein, UA: NEGATIVE
Spec Grav, UA: 1.01 (ref 1.010–1.025)
Urobilinogen, UA: 0.2 U/dL
pH, UA: 5 (ref 5.0–8.0)

## 2023-12-07 NOTE — Progress Notes (Addendum)
    NURSE VISIT NOTE  Subjective:    Patient ID: Maria Drake, female    DOB: 09/10/1980, 43 y.o.   MRN: [id number]  HPI  Patient is a 43 y.o. G63P1001 female who presents for approximately one week of bilateral lower abdominal discomfort with a vaginal burning and odor she noticed yesterday.  She says she is having mostly clear discharge with some brown discoloration.  She reports mild dysuria and pain when bladder is full.  Completed a course of antibiotics for sinus infection  12/04/23.  Denies known exposure to STI.    Objective:    BP (!) 150/92   Pulse 97   Ht 5\' 3"  (1.6 m)   Wt 183 lb (83 kg)   LMP 11/22/2023 (Exact Date)   BMI 32.42 kg/m     Assessment:   1. Acute vaginitis   2. Dysuria       Plan:   GC, CT, Trich, BV and Yeast probe sent to lab.  Urinalysis negative in clinic today - urine sent for culture due to symptoms.  ROV prn if symptoms persist or worsen.   Juanita Norlander, RN

## 2023-12-08 LAB — CERVICOVAGINAL ANCILLARY ONLY
Bacterial Vaginitis (gardnerella): NEGATIVE
Candida Glabrata: NEGATIVE
Candida Vaginitis: POSITIVE — AB
Chlamydia: NEGATIVE
Comment: NEGATIVE
Comment: NEGATIVE
Comment: NEGATIVE
Comment: NEGATIVE
Comment: NEGATIVE
Comment: NORMAL
Neisseria Gonorrhea: NEGATIVE
Trichomonas: NEGATIVE

## 2023-12-09 ENCOUNTER — Telehealth: Payer: Self-pay

## 2023-12-09 MED ORDER — FLUCONAZOLE 150 MG PO TABS: 150.0000 mg | ORAL_TABLET | Freq: Once | ORAL | 0 refills | Status: AC

## 2023-12-09 NOTE — Addendum Note (Signed)
 Addended by: Juanita Norlander on: 12/09/2023 11:35 AM   Modules accepted: Orders

## 2023-12-09 NOTE — Telephone Encounter (Signed)
 Called patient to relay positive yeast results from Aptima swab.  Diflucan  rx sent to pharmacy on file.  Patient informed we are still waiting on urine culture results but we will call if further treatment is needed.  Liane Redman RN

## 2023-12-10 LAB — URINE CULTURE

## 2023-12-12 ENCOUNTER — Telehealth: Payer: Self-pay

## 2023-12-12 DIAGNOSIS — B379 Candidiasis, unspecified: Secondary | ICD-10-CM

## 2023-12-12 MED ORDER — SULFAMETHOXAZOLE-TRIMETHOPRIM 800-160 MG PO TABS: 1.0000 | ORAL_TABLET | Freq: Two times a day (BID) | ORAL | 0 refills | Status: AC

## 2023-12-12 MED ORDER — FLUCONAZOLE 150 MG PO TABS
150.0000 mg | ORAL_TABLET | Freq: Once | ORAL | 0 refills | Status: AC
Start: 2023-12-12 — End: 2023-12-12

## 2023-12-12 NOTE — Telephone Encounter (Signed)
 Called patient to let her know her urine culture came back positive for E. Coli. Bactrim DS sent to patient pharmacy.  Patient wonders what she can do to reduce chance of getting a UTI.  Counseled on increasing fluids, wiping front to back, urinating after intercourse and taking cranberry supplements or juice daily.    She also reports taking her diflucan  on Friday but continues to have some intermittent vaginal itching.  Advised the antibiotics she is about to start can also increase yeast, so recommended she use a 7 day Monistat treatment while on antibiotics, followed by a Diflucan  when the course is complete if she is still having symptoms.  Pt verbalized understanding and agreement.  Diflucan  sent to patient pharmacy.  Liane Redman RN

## 2023-12-12 NOTE — Addendum Note (Signed)
 Addended by: Juanita Norlander on: 12/12/2023 09:17 AM   Modules accepted: Orders

## 2023-12-26 DIAGNOSIS — J320 Chronic maxillary sinusitis: Secondary | ICD-10-CM | POA: Diagnosis not present

## 2023-12-26 DIAGNOSIS — J301 Allergic rhinitis due to pollen: Secondary | ICD-10-CM | POA: Diagnosis not present

## 2024-01-27 ENCOUNTER — Ambulatory Visit
Admission: RE | Admit: 2024-01-27 | Discharge: 2024-01-27 | Disposition: A | Discharge status: Home / Self Care | Source: Ambulatory Visit | Attending: Emergency Medicine | Admitting: Emergency Medicine

## 2024-01-27 VITALS — BP 121/74 | HR 110 | Temp 98.7°F | Resp 16 | Ht 63.0 in | Wt 175.0 lb

## 2024-01-27 DIAGNOSIS — R102 Pelvic and perineal pain: Secondary | ICD-10-CM | POA: Insufficient documentation

## 2024-01-27 DIAGNOSIS — L292 Pruritus vulvae: Secondary | ICD-10-CM | POA: Diagnosis not present

## 2024-01-27 DIAGNOSIS — R350 Frequency of micturition: Secondary | ICD-10-CM | POA: Diagnosis not present

## 2024-01-27 LAB — POCT URINALYSIS DIP (MANUAL ENTRY)
Bilirubin, UA: NEGATIVE
Blood, UA: NEGATIVE
Glucose, UA: NEGATIVE mg/dL
Ketones, POC UA: NEGATIVE mg/dL
Leukocytes, UA: NEGATIVE
Nitrite, UA: NEGATIVE
Protein Ur, POC: NEGATIVE mg/dL
Spec Grav, UA: 1.02 (ref 1.010–1.025)
Urobilinogen, UA: 0.2 U/dL
pH, UA: 7.5 (ref 5.0–8.0)

## 2024-01-27 MED ORDER — METRONIDAZOLE 500 MG PO TABS: 500.0000 mg | ORAL_TABLET | Freq: Two times a day (BID) | ORAL | 0 refills | Status: DC

## 2024-01-27 NOTE — ED Provider Notes (Signed)
 [facility]    CSN: [id number] Arrival date & time: 01/27/24  1252      History   Chief Complaint Chief Complaint  Patient presents with   Urinary Frequency    Some pelvic pain.SABRASABRAPossible UTI? - Entered by patient    HPI Maria Drake is a 43 y.o. female.   Patient presents for evaluation of urinary frequency and mid to right sided low back pain occurring intermittently.  Has noticed some mild odor while wiping and experiencing mild itching.  Has had intermittent lower abdominal pain since May 2025.  In May experienced UTI and yeast infection, completed medicine, endorses symptoms improved but pelvic pain continued to persist.  Sexually active no concern for STI.  Denies hematuria, dysuria or vaginal discharge.  Has attempted use of plexus probiotic medication, external Monistat.  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   Hypothyroidism 09/27/2017   Endometriosis of pelvis 03/17/2016   Rhinitis, allergic 03/17/2016   Oligo-ovulation 03/17/2016   Acne 03/17/2016   PCOS (polycystic ovarian syndrome) 03/17/2016   Anxiety 03/17/2016    Past Surgical History:  Procedure Laterality Date   CESAREAN SECTION     CHOLECYSTECTOMY     LAPAROSCOPIC ENDOMETRIOSIS FULGURATION     ROBOTIC ASSISTED LAPAROSCOPIC CHOLECYSTECTOMY-MULTI SITE N/A 06/22/2018   Procedure: ROBOTIC ASSISTED LAPAROSCOPIC CHOLECYSTECTOMY-MULTI SITE;  Surgeon: Jordis Laneta FALCON, MD;  Location: [facility]  ORS;  Service: General;  Laterality: N/A;   WISDOM TOOTH EXTRACTION      OB History     Gravida  1   Para  1   Term  1   Preterm      AB      Living  1      SAB      IAB      Ectopic      Multiple      Live Births  1            Home Medications    Prior to Admission medications   Medication Sig Start Date End Date Taking? Authorizing Provider  metroNIDAZOLE  (FLAGYL ) 500 MG tablet Take 1 tablet (500 mg total) by mouth 2 (two) times daily. 01/27/24  Yes Jartavious Mckimmy R, NP  montelukast (SINGULAIR) 10 MG tablet Take 10 mg by mouth at bedtime.   Yes [provider]  acetaminophen  (TYLENOL ) 500 MG tablet Take 500 mg by mouth every 6 (six) hours as needed for moderate pain.     [provider]  cetirizine (ZYRTEC) 10 MG tablet Take 10 mg by mouth daily. Patient not taking: Reported on 12/07/2023    [provider]  clobetasol ointment (TEMOVATE) 0.05 %  11/24/21   [provider]  ibuprofen (ADVIL,MOTRIN) 200 MG tablet Take 200 mg by mouth every 6 (six) hours as needed. Patient not taking: Reported on 12/07/2023    [provider]  letrozole  (FEMARA ) 2.5 MG tablet Take 1 tablet daily on Days 5-9 of your cycle.  If no pregnancy after 2 months, increase to 2 tablets daily Patient not taking: Reported on 12/07/2023 01/15/22   Connell Davies, MD  metFORMIN  (GLUCOPHAGE ) 500 MG tablet TAKE 1 TABLET BY MOUTH DAILY FOR 1 WEEK. INCREASE TO 1 TABLET 2 TIMES A DAY FOR 1 WEEK. THEN 2 TABLETS 2 TIMES A DAY Patient not taking: Reported on 12/07/2023 01/03/23   Connell Davies, MD  montelukast (SINGULAIR) 10 MG tablet Take 1 tablet by mouth at bedtime. 01/29/22 01/29/23  [provider]  Olopatadine-Mometasone (RYALTRIS NA)     [provider]  Prenatal Vit-Fe Fumarate-FA (PRENATAL MULTIVITAMIN) TABS tablet Take 1 tablet by mouth daily at 12 noon.    [provider]  Triamcinolone Acetonide (NASACORT AQ NA) Place into the  nose.    [provider]  UNABLE TO FIND Take 1 tablet by mouth daily. Med Name: plexus gut health supplement    [provider]  fluticasone (FLONASE) 50 MCG/ACT nasal spray Place 2 sprays into the nose daily as needed for allergies.  09/03/15 06/01/20  [provider]  Norethindrone -Ethinyl Estradiol -Fe Biphas (LO LOESTRIN FE) 1 MG-10 MCG / 10 MCG tablet Take 1 tablet by mouth daily. 11/20/19 05/11/20  Connell Davies, MD  NORLYDA  0.35 MG tablet TAKE 1 TABLET(0.35 MG) BY MOUTH DAILY 10/15/19 05/11/20  Connell Davies, MD    Family History Family History  Problem Relation Age of Onset   Diabetes Mother    Heart disease Father    Diabetes Paternal Uncle    Diabetes Maternal Grandfather    Diabetes Paternal Grandfather    Heart disease Paternal Grandfather    Breast cancer Neg Hx    Ovarian cancer Neg Hx     Social History Social History   Tobacco Use   Smoking status: Never   Smokeless tobacco: Never  Vaping Use   Vaping status: Never Used  Substance Use Topics   Alcohol use: No   Drug use: No     Allergies   Decongest-aid [pseudoephedrine] and Codeine   Review of Systems Review of Systems   Physical Exam Triage Vital Signs ED Triage Vitals  Encounter Vitals Group     BP 01/27/24 1320 121/74     Girls Systolic BP Percentile --      Girls Diastolic BP Percentile --      Boys Systolic BP Percentile --      Boys Diastolic BP Percentile --      Pulse Rate 01/27/24 1320 (!) 110     Resp 01/27/24 1320 16     Temp 01/27/24 1320 98.7 F (37.1 C)     Temp Source 01/27/24 1320 Oral     SpO2 01/27/24 1320 100 %     Weight 01/27/24 1313 175 lb (79.4 kg)     Height 01/27/24 1313 5' 3 (1.6 m)     Head Circumference --      Peak Flow --      Pain Score 01/27/24 1313 5     Pain Loc --      Pain Education --      Exclude from Growth Chart --    No data found.  Updated Vital Signs BP 121/74   Pulse (!) 110   Temp 98.7 F (37.1 C) (Oral)    Resp 16   Ht 5' 3 (1.6 m)   Wt 175 lb (79.4 kg)   LMP 01/12/2024   SpO2 100%   BMI 31.00 kg/m   Visual Acuity Right Eye Distance:  Left Eye Distance:   Bilateral Distance:    Right Eye Near:   Left Eye Near:    Bilateral Near:     Physical Exam Constitutional:      Appearance: Normal appearance.  Eyes:     Extraocular Movements: Extraocular movements intact.  Pulmonary:     Effort: Pulmonary effort is normal.  Abdominal:     Tenderness: There is abdominal tenderness in the suprapubic area. There is no right CVA tenderness or left CVA tenderness.  Neurological:     Mental Status: She is alert and oriented to person, place, and time. Mental status is at baseline.      UC Treatments / Results  Labs (all labs ordered are listed, but only abnormal results are displayed) Labs Reviewed  URINE CULTURE  POCT URINALYSIS DIP (MANUAL ENTRY)  CERVICOVAGINAL ANCILLARY ONLY    EKG   Radiology No results found.  Procedures Procedures (including critical care time)  Medications Ordered in UC Medications - No data to display  Initial Impression / Assessment and Plan / UC Course  I have reviewed the triage vital signs and the nursing notes.  Pertinent labs & imaging results that were available during my care of the patient were reviewed by me and considered in my medical decision making (see chart for details).  suprapubic pressure, urinary frequency  Urinalysis negative, sent for culture, attempted over-the-counter Monistat without relief.,  Vaginal swab checking for yeast and BV pending, and empirically treating for bacterial vaginosis with metronidazole  advised against alcohol use throughout treatment, recommended nonpharmacological measures and advised to follow-up with gynecology if symptoms continue to persist  Final Clinical Impressions(s) / UC Diagnoses   Final diagnoses:  Urinary frequency  Suprapubic pressure     Discharge Instructions      Today you  are being treated for  Bacterial vaginosis   Urinalysis is negative for bladder infection, has been sent to the lab to see if bacteria will grow, you will be notified if this occurs and additional medicine sent to the pharmacy  Take Metronidazole  500 mg twice a day for 7 days, do not drink alcohol while using medication, this will make you feel sick   Bacterial vaginosis which results from an overgrowth of one on several organisms that are normally present in your vagina. Vaginosis is an inflammation of the vagina that can result in discharge, itching and pain.   In addition: Avoid baths, hot tubs and whirlpool spas.  Don't use scented or harsh soaps Avoid irritants. These include scented tampons and pads. Wipe from front to back after using the toilet. Don't douche. Your vagina doesn't require cleansing other than normal bathing.  Use a condom.  Wear cotton underwear, this fabric absorbs some moisture.        ED Prescriptions     Medication Sig Dispense Auth. Provider   metroNIDAZOLE  (FLAGYL ) 500 MG tablet Take 1 tablet (500 mg total) by mouth 2 (two) times daily. 14 tablet [place] Cantero R, NP      PDMP not reviewed this encounter.   Teresa Shelba SAUNDERS, NP 01/27/24 1359

## 2024-01-27 NOTE — Discharge Instructions (Signed)
 Today you are being treated for  Bacterial vaginosis   Urinalysis is negative for bladder infection, has been sent to the lab to see if bacteria will grow, you will be notified if this occurs and additional medicine sent to the pharmacy  Take Metronidazole  500 mg twice a day for 7 days, do not drink alcohol while using medication, this will make you feel sick   Bacterial vaginosis which results from an overgrowth of one on several organisms that are normally present in your vagina. Vaginosis is an inflammation of the vagina that can result in discharge, itching and pain.   In addition: Avoid baths, hot tubs and whirlpool spas.  Don't use scented or harsh soaps Avoid irritants. These include scented tampons and pads. Wipe from front to back after using the toilet. Don't douche. Your vagina doesn't require cleansing other than normal bathing.  Use a condom.  Wear cotton underwear, this fabric absorbs some moisture.

## 2024-01-27 NOTE — Progress Notes (Deleted)
    NURSE VISIT NOTE  Subjective:    Patient ID: Maria Drake, female    DOB: [birth year], 43 y.o.   MRN: [id number]       HPI  Patient is a 43 y.o. G82P1001 female who presents for {UTI Symptoms:210800002} for {0-10:33138} {TIME; UNITS DAY/WEEK/MONTH:19136}.  Patient denies {UTI Symptoms:210800002}.  Patient {does/does not:33181} have a history of recurrent UTI.  Patient {does/does not:33181} have a history of pyelonephritis.    Objective:    LMP 01/12/2024    Lab Review  No results found for any visits on 01/30/24.  Assessment:   No diagnosis found.   Plan:   {AOB UTI PLAN:28528:p}   Burnard LITTIE Ro, CMA

## 2024-01-27 NOTE — ED Triage Notes (Addendum)
 Patient in office today complaint of pain in pelvic area since May 2025 on and off. Also back pain since this morning, frequency.   OTC: Monostat, Cranberry supplement, Plexus   Denies: N/V and fever

## 2024-01-28 LAB — URINE CULTURE: Culture: 10000 — AB

## 2024-01-30 ENCOUNTER — Ambulatory Visit (HOSPITAL_COMMUNITY): Payer: Self-pay

## 2024-01-30 ENCOUNTER — Ambulatory Visit

## 2024-01-30 LAB — CERVICOVAGINAL ANCILLARY ONLY
Bacterial Vaginitis (gardnerella): NEGATIVE
Candida Glabrata: NEGATIVE
Candida Vaginitis: POSITIVE — AB
Comment: NEGATIVE
Comment: NEGATIVE
Comment: NEGATIVE

## 2024-01-31 MED ORDER — FLUCONAZOLE 150 MG PO TABS: 150.0000 mg | ORAL_TABLET | Freq: Once | ORAL | 0 refills | Status: AC

## 2024-03-08 ENCOUNTER — Ambulatory Visit: Admitting: Obstetrics and Gynecology

## 2024-03-08 ENCOUNTER — Encounter: Payer: Self-pay | Admitting: Obstetrics and Gynecology

## 2024-03-08 ENCOUNTER — Other Ambulatory Visit (HOSPITAL_COMMUNITY)
Admission: RE | Admit: 2024-03-08 | Discharge: 2024-03-08 | Disposition: A | Discharge status: Home / Self Care | Source: Ambulatory Visit | Attending: Obstetrics and Gynecology | Admitting: Obstetrics and Gynecology

## 2024-03-08 VITALS — BP 132/85 | HR 90 | Ht 63.0 in | Wt 183.0 lb

## 2024-03-08 DIAGNOSIS — Z01411 Encounter for gynecological examination (general) (routine) with abnormal findings: Secondary | ICD-10-CM

## 2024-03-08 DIAGNOSIS — R102 Pelvic and perineal pain: Secondary | ICD-10-CM | POA: Diagnosis not present

## 2024-03-08 DIAGNOSIS — N852 Hypertrophy of uterus: Secondary | ICD-10-CM

## 2024-03-08 DIAGNOSIS — R87610 Atypical squamous cells of undetermined significance on cytologic smear of cervix (ASC-US): Secondary | ICD-10-CM

## 2024-03-08 DIAGNOSIS — Z124 Encounter for screening for malignant neoplasm of cervix: Secondary | ICD-10-CM | POA: Diagnosis not present

## 2024-03-08 DIAGNOSIS — Z1231 Encounter for screening mammogram for malignant neoplasm of breast: Secondary | ICD-10-CM

## 2024-03-08 DIAGNOSIS — R3915 Urgency of urination: Secondary | ICD-10-CM

## 2024-03-08 DIAGNOSIS — Z01419 Encounter for gynecological examination (general) (routine) without abnormal findings: Secondary | ICD-10-CM

## 2024-03-08 DIAGNOSIS — Z1151 Encounter for screening for human papillomavirus (HPV): Secondary | ICD-10-CM | POA: Insufficient documentation

## 2024-03-08 DIAGNOSIS — N3946 Mixed incontinence: Secondary | ICD-10-CM

## 2024-03-08 LAB — POCT URINALYSIS DIPSTICK
Bilirubin, UA: NEGATIVE
Blood, UA: POSITIVE
Glucose, UA: NEGATIVE
Ketones, UA: NEGATIVE
Leukocytes, UA: NEGATIVE
Nitrite, UA: NEGATIVE
Protein, UA: NEGATIVE
Urobilinogen, UA: 0.2 U/dL
pH, UA: 7 (ref 5.0–8.0)

## 2024-03-08 NOTE — Patient Instructions (Addendum)
 I value your feedback and you entrusting [place] with your care. If you get a [facility] patient survey, I would appreciate you taking the time to let us know about your experience today. Thank you!  [facility] ([place])--[phone]

## 2024-03-08 NOTE — Progress Notes (Addendum)
 PCP:  Jyl Railing, MD   Chief Complaint  Patient presents with   Gynecologic Exam    On/off dull pelvic pain noticed since end of last year.     HPI:      Ms. GRACYNN RAJEWSKI is a 43 y.o. G1P1001 whose LMP was Patient's last menstrual period was 03/05/2024 (exact date)., presents today for her annual examination.  Her menses are regular every 28-30 days, lasting 4-5 days, light to mod flow, occas BTB around ovulation, mild dysmen, improved with tylenol .  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 Did lupron and BC in past. Sx improved overall--has occas twinge rectally.   Sex activity: single partner, contraception - none, conception ok. Did fertility treatments in past. Taking PNVs. No pain/bleeding/dryness during sex, sometimes pelvic discomfort after sex. Last Pap: 12/17/21 Results were: ASCUS with NEGATIVE high risk HPV ; repeat pap today  Last mammogram: never There is no FH of breast cancer. There is no FH of ovarian cancer. The patient does do self-breast exams.  Tobacco use: The patient denies current or previous tobacco use. Alcohol use: rare No drug use.  Exercise: not active  She does get adequate calcium and some Vitamin D in her diet.  Has had pelvic pain intermittently since 11/24; never did GYN u/s last yr since sx improved. Sx are dull constant pain that lasts a few days to a week when occurs; then resolved. No urin, GI sx with pain but has pelvic  fullness at times. Has BM every day to every other day.  Benjaman has urinary urgency/urge incont if hold urine when has to go. Drinks a little caffeine daily, not enough water. Has occas SUI with coughing. Not doing pelvic exercises. Hx of E Coli on C&S 5/25.  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   Hypothyroidism 09/27/2017   Endometriosis of pelvis 03/17/2016   Rhinitis, allergic 03/17/2016   Oligo-ovulation 03/17/2016   Acne 03/17/2016   PCOS (polycystic ovarian syndrome) 03/17/2016   Anxiety 03/17/2016    Past Surgical History:  Procedure Laterality Date   CESAREAN SECTION     CHOLECYSTECTOMY     LAPAROSCOPIC ENDOMETRIOSIS FULGURATION     ROBOTIC ASSISTED LAPAROSCOPIC CHOLECYSTECTOMY-MULTI SITE N/A 06/22/2018   Procedure: ROBOTIC ASSISTED LAPAROSCOPIC CHOLECYSTECTOMY-MULTI SITE;  Surgeon: Jordis Laneta FALCON, MD;  Location: [facility];  Service: General;  Laterality: N/A;   WISDOM TOOTH EXTRACTION      Family History  Problem Relation Age of Onset   Diabetes Mother    Heart disease Father    Diabetes Paternal Uncle    Diabetes Maternal Grandfather    Diabetes Paternal Grandfather    Heart disease Paternal Grandfather    Breast cancer Neg Hx    Ovarian cancer Neg Hx     Social History   Socioeconomic History   Marital status: Married    Spouse name: Not on file   Number of children: Not on file   Years of education: Not on file   Highest education level: Not on file  Occupational History   Not on file  Tobacco Use   Smoking status: Never   Smokeless tobacco: Never  Vaping Use   Vaping status: Never Used  Substance and Sexual Activity  Alcohol use: Yes    Comment: once a year   Drug use: No   Sexual activity: Yes    Birth control/protection: None  Other Topics Concern   Not on file  Social History Narrative   Not on file   Social Drivers of Health   Financial Resource Strain: Not on file  Food Insecurity: Not on file  Transportation Needs: Not on file  Physical Activity: Not on file  Stress: Not on file  Social Connections: Not on file  Intimate Partner Violence: Not on file     Current Outpatient Medications:    cetirizine (ZYRTEC) 10 MG tablet, Take 10 mg by mouth daily., Disp:  , Rfl:    clobetasol ointment (TEMOVATE) 0.05 %, , Disp: , Rfl:    montelukast (SINGULAIR) 10 MG tablet, Take 10 mg by mouth at bedtime., Disp: , Rfl:    Prenatal Vit-Fe Fumarate-FA (PRENATAL MULTIVITAMIN) TABS tablet, Take 1 tablet by mouth daily at 12 noon., Disp: , Rfl:    Triamcinolone Acetonide (NASACORT AQ NA), Place into the nose., Disp: , Rfl:    UNABLE TO FIND, Take 1 tablet by mouth daily. Med Name: plexus gut health supplement, Disp: , Rfl:      ROS:  Review of Systems  Constitutional:  Negative for fatigue, fever and unexpected weight change.  Respiratory:  Negative for cough, shortness of breath and wheezing.   Cardiovascular:  Negative for chest pain, palpitations and leg swelling.  Gastrointestinal:  Negative for blood in stool, constipation, diarrhea, nausea and vomiting.  Endocrine: Negative for cold intolerance, heat intolerance and polyuria.  Genitourinary:  Positive for frequency, pelvic pain and urgency. Negative for dyspareunia, dysuria, flank pain, genital sores, hematuria, menstrual problem, vaginal bleeding, vaginal discharge and vaginal pain.  Musculoskeletal:  Negative for back pain, joint swelling and myalgias.  Skin:  Negative for rash.  Neurological:  Negative for dizziness, syncope, light-headedness, numbness and headaches.  Hematological:  Negative for adenopathy.  Psychiatric/Behavioral:  Negative for agitation, confusion, sleep disturbance and suicidal ideas. The patient is not nervous/anxious.    BREAST: No symptoms   Objective: BP 132/85   Pulse 90   Ht 5' 3 (1.6 m)   Wt 183 lb (83 kg)   LMP 03/05/2024 (Exact Date)   BMI 32.42 kg/m    Physical Exam Constitutional:      Appearance: She is well-developed.  Genitourinary:     Vulva normal.     Right Labia: No rash, tenderness or lesions.    Left Labia: No tenderness, lesions or rash.    Vaginal bleeding present.     No vaginal discharge, erythema or tenderness.     No vaginal prolapse  present.     Right Adnexa: not tender and no mass present.    Left Adnexa: not tender and no mass present.    No cervical friability or polyp.     Uterus is enlarged.     Uterus is not tender.     Uterine mass present. Breasts:    Right: No mass, nipple discharge, skin change or tenderness.     Left: No mass, nipple discharge, skin change or tenderness.  Neck:     Thyroid : No thyromegaly.  Cardiovascular:     Rate and Rhythm: Normal rate and regular rhythm.     Heart sounds: Normal heart sounds. No murmur heard. Pulmonary:     Effort: Pulmonary effort is normal.     Breath sounds: Normal breath sounds.  Abdominal:  Palpations: Abdomen is soft. There is mass.     Tenderness: There is no abdominal tenderness. There is no guarding or rebound.   Musculoskeletal:        General: Normal range of motion.     Cervical back: Normal range of motion.  Lymphadenopathy:     Cervical: No cervical adenopathy.  Neurological:     General: No focal deficit present.     Mental Status: She is alert and oriented to person, place, and time.     Cranial Nerves: No cranial nerve deficit.  Skin:    General: Skin is warm and dry.  Psychiatric:        Mood and Affect: Mood normal.        Behavior: Behavior normal.        Thought Content: Thought content normal.        Judgment: Judgment normal.  Vitals reviewed.     Results: Results for orders placed or performed in visit on 03/08/24 (from the past 24 hours)  POCT urinalysis dipstick     Status: None   Collection Time: 03/08/24  4:56 PM  Result Value Ref Range   Color, UA     Clarity, UA     Glucose, UA Negative Negative   Bilirubin, UA neg    Ketones, UA neg    Spec Grav, UA     Blood, UA positive    pH, UA 7.0 5.0 - 8.0   Protein, UA Negative Negative   Urobilinogen, UA 0.2 0.2 or 1.0 E.U./dL   Nitrite, UA neg    Leukocytes, UA Negative Negative   Appearance     Odor    Pt having period  Assessment/Plan: Encounter for  annual routine gynecological examination - Plan: POCT urinalysis dipstick  Cervical cancer screening - Plan: Cytology - PAP  Screening for HPV (human papillomavirus) - Plan: Cytology - PAP  ASCUS of cervix with negative high risk HPV - Plan: Cytology - PAP; repeat pap today, will f/u if abn.   Encounter for screening mammogram for malignant neoplasm of breast - Plan: MM 3D SCREENING MAMMOGRAM BILATERAL BREAST; pt to schedule mammo  Pelvic pain - Plan: Urine Culture, US  PELVIS TRANSVAGINAL NON-OB (TV ONLY); neg UA, check culture, enlarged uterus on exam, check GYN u/s. Will f/u with results. Discussed tx options re: leio   Enlarged uterus - Plan: US  PELVIS TRANSVAGINAL NON-OB (TV ONLY)  Urinary urgency - Plan: Urine Culture; neg UA, check C&S. D/c caffeine/pelvic exercises. No cystocele.   Mixed incontinence--void Q1-2 hrs, d/c caffeine, increase water, pelvic exercises.            GYN counsel breast self exam, mammography screening, adequate intake of calcium and vitamin D, diet and exercise     F/U  Return in about 1 week (around 03/15/2024) for GYN u/s for pelvic pain/leio.  Derriana Oser B. Albaro Deviney, PA-C 03/08/2024 5:11 PM

## 2024-03-09 DIAGNOSIS — R3915 Urgency of urination: Secondary | ICD-10-CM | POA: Diagnosis not present

## 2024-03-09 DIAGNOSIS — R102 Pelvic and perineal pain: Secondary | ICD-10-CM | POA: Diagnosis not present

## 2024-03-14 ENCOUNTER — Ambulatory Visit: Payer: Self-pay | Admitting: Obstetrics and Gynecology

## 2024-03-14 LAB — CYTOLOGY - PAP
Comment: NEGATIVE
Diagnosis: NEGATIVE
Diagnosis: REACTIVE
High risk HPV: NEGATIVE

## 2024-03-14 LAB — URINE CULTURE

## 2024-03-14 MED ORDER — NITROFURANTOIN MONOHYD MACRO 100 MG PO CAPS: 100.0000 mg | ORAL_CAPSULE | Freq: Two times a day (BID) | ORAL | 0 refills | Status: AC

## 2024-03-14 NOTE — Addendum Note (Signed)
 Addended by: WATT HILA B on: 03/14/2024 11:50 AM   Modules accepted: Orders

## 2024-03-29 ENCOUNTER — Ambulatory Visit
Admission: RE | Admit: 2024-03-29 | Discharge: 2024-03-29 | Disposition: A | Discharge status: Home / Self Care | Source: Ambulatory Visit | Attending: Obstetrics and Gynecology | Admitting: Obstetrics and Gynecology

## 2024-03-29 DIAGNOSIS — Z1231 Encounter for screening mammogram for malignant neoplasm of breast: Secondary | ICD-10-CM | POA: Insufficient documentation

## 2024-04-02 ENCOUNTER — Telehealth: Payer: Self-pay | Admitting: Obstetrics and Gynecology

## 2024-04-02 ENCOUNTER — Ambulatory Visit (INDEPENDENT_AMBULATORY_CARE_PROVIDER_SITE_OTHER)

## 2024-04-02 DIAGNOSIS — N852 Hypertrophy of uterus: Secondary | ICD-10-CM | POA: Diagnosis not present

## 2024-04-02 DIAGNOSIS — R102 Pelvic and perineal pain: Secondary | ICD-10-CM | POA: Diagnosis not present

## 2024-04-02 DIAGNOSIS — N838 Other noninflammatory disorders of ovary, fallopian tube and broad ligament: Secondary | ICD-10-CM

## 2024-04-02 DIAGNOSIS — N80559 Endometriosis of other parts of the colon, unspecified depth: Secondary | ICD-10-CM

## 2024-04-02 NOTE — Telephone Encounter (Signed)
 Pt aware of GYN u/s results and bilat ovar masses. Aware to go to ED if severe pain due to risk of ovarian torsion. Otherwise, I will discuss with MD tomorrow so we can create surgical plan.

## 2024-04-03 NOTE — Telephone Encounter (Signed)
 I discussed with Dr. Fredirick and Dr. Jeralyn. Recommended pt see Gyn onc given complexity of case and hx of endometriosis in past on her colon. Referral placed. Will check ca-125. Pt aware.

## 2024-04-04 ENCOUNTER — Other Ambulatory Visit

## 2024-04-04 DIAGNOSIS — N838 Other noninflammatory disorders of ovary, fallopian tube and broad ligament: Secondary | ICD-10-CM

## 2024-04-05 ENCOUNTER — Encounter: Payer: Self-pay | Admitting: Obstetrics and Gynecology

## 2024-04-05 ENCOUNTER — Other Ambulatory Visit: Payer: Self-pay

## 2024-04-05 ENCOUNTER — Ambulatory Visit: Payer: Self-pay | Admitting: Obstetrics and Gynecology

## 2024-04-05 DIAGNOSIS — R971 Elevated cancer antigen 125 [CA 125]: Secondary | ICD-10-CM

## 2024-04-05 DIAGNOSIS — N9489 Other specified conditions associated with female genital organs and menstrual cycle: Secondary | ICD-10-CM

## 2024-04-05 LAB — CA 125: Cancer Antigen (CA) 125: 68.3 U/mL — ABNORMAL HIGH (ref 0.0–38.1)

## 2024-04-05 NOTE — Progress Notes (Addendum)
 Spoke with Maria Drake and appointment has been arranged for 10/1 with gyn oncology. Insurance has authorized CT scan and this has been scheduled for 9/26.   GYN ULTRASOUND REPORT   Patient Name: Maria Drake DOB: [birth year] MRN: [id number] LMP:    Location: [facility] Date of Service: 04/02/2024 Ordering Provider: Bernarda Schroeder, [facility]  Indications:Pelvic pain/Enlarged Uterus. Findings:  The uterus is anteverted and measures 9.2 x 4.2 x 4.2 cm with a uterine volume of 85.84 ml. Echo texture is homogenous without evidence of focal masses.  The Endometrium measures 3.4 mm.  Right Ovary was not identified due to large complex mass seen in right adnexa measuring: 13.77 x 8.0 x 12.1 cm.  Left Ovary was not identified due to mass seen in left adnexa measuring: 10.5 x 7.8 x 10.9 cm. Unable to visualize any ovarian tissue on either side. There is no free fluid in the cul de sac.  Impression: 1. No uterine fibroid was seen. 2. Large complex masses were noted in either adnexa,most likely extending off of ovaries.  Recommendations: 1.Clinical correlation with the patient's History and Physical Exam. 2. Patient to speak to doctor about treatment.  Berwyn DELENA Hummer, RDMS (AB,OB,BR),RVT

## 2024-04-06 ENCOUNTER — Ambulatory Visit
Admission: RE | Admit: 2024-04-06 | Discharge: 2024-04-06 | Disposition: A | Discharge status: Home / Self Care | Source: Ambulatory Visit | Attending: Nurse Practitioner | Admitting: Nurse Practitioner

## 2024-04-06 DIAGNOSIS — N9489 Other specified conditions associated with female genital organs and menstrual cycle: Secondary | ICD-10-CM | POA: Diagnosis not present

## 2024-04-06 DIAGNOSIS — R971 Elevated cancer antigen 125 [CA 125]: Secondary | ICD-10-CM | POA: Insufficient documentation

## 2024-04-06 DIAGNOSIS — K760 Fatty (change of) liver, not elsewhere classified: Secondary | ICD-10-CM | POA: Diagnosis not present

## 2024-04-06 DIAGNOSIS — N1330 Unspecified hydronephrosis: Secondary | ICD-10-CM | POA: Diagnosis not present

## 2024-04-06 DIAGNOSIS — K449 Diaphragmatic hernia without obstruction or gangrene: Secondary | ICD-10-CM | POA: Diagnosis not present

## 2024-04-06 DIAGNOSIS — R935 Abnormal findings on diagnostic imaging of other abdominal regions, including retroperitoneum: Secondary | ICD-10-CM | POA: Diagnosis not present

## 2024-04-06 MED ORDER — IOHEXOL 350 MG/ML SOLN
60.0000 mL | Freq: Once | INTRAVENOUS | Status: AC | PRN
  Administered 2024-04-06: 60 mL via INTRAVENOUS

## 2024-04-09 ENCOUNTER — Telehealth: Payer: Self-pay

## 2024-04-09 NOTE — Telephone Encounter (Signed)
 Received call from Ms. Santibanez stating she has seen the results of her CT scan. She reports history of liver lesion in the past. She had a  MRI in the fall of 2019 (05/18/2018) and again in the spring of 2020  (11/29/2018). Images requested for comparison.

## 2024-04-10 ENCOUNTER — Ambulatory Visit: Admitting: Obstetrics and Gynecology

## 2024-04-10 ENCOUNTER — Inpatient Hospital Stay
Admission: RE | Admit: 2024-04-10 | Discharge: 2024-04-10 | Disposition: A | Payer: Self-pay | Source: Ambulatory Visit | Attending: Nurse Practitioner

## 2024-04-10 ENCOUNTER — Other Ambulatory Visit: Payer: Self-pay

## 2024-04-10 ENCOUNTER — Inpatient Hospital Stay
Admission: RE | Admit: 2024-04-10 | Discharge: 2024-04-10 | Disposition: A | Discharge status: Home / Self Care | Payer: Self-pay | Source: Ambulatory Visit | Attending: Nurse Practitioner | Admitting: Nurse Practitioner

## 2024-04-10 DIAGNOSIS — K769 Liver disease, unspecified: Secondary | ICD-10-CM

## 2024-04-11 ENCOUNTER — Inpatient Hospital Stay: Attending: Obstetrics and Gynecology | Admitting: Obstetrics and Gynecology

## 2024-04-11 ENCOUNTER — Inpatient Hospital Stay

## 2024-04-11 VITALS — BP 161/98 | HR 104 | Temp 98.6°F | Resp 20 | Wt 177.9 lb

## 2024-04-11 DIAGNOSIS — R971 Elevated cancer antigen 125 [CA 125]: Secondary | ICD-10-CM

## 2024-04-11 DIAGNOSIS — E039 Hypothyroidism, unspecified: Secondary | ICD-10-CM | POA: Insufficient documentation

## 2024-04-11 DIAGNOSIS — E282 Polycystic ovarian syndrome: Secondary | ICD-10-CM | POA: Insufficient documentation

## 2024-04-11 DIAGNOSIS — F419 Anxiety disorder, unspecified: Secondary | ICD-10-CM | POA: Insufficient documentation

## 2024-04-11 DIAGNOSIS — N979 Female infertility, unspecified: Secondary | ICD-10-CM | POA: Diagnosis not present

## 2024-04-11 DIAGNOSIS — R1900 Intra-abdominal and pelvic swelling, mass and lump, unspecified site: Secondary | ICD-10-CM | POA: Insufficient documentation

## 2024-04-11 DIAGNOSIS — N809 Endometriosis, unspecified: Secondary | ICD-10-CM | POA: Diagnosis not present

## 2024-04-11 DIAGNOSIS — N1330 Unspecified hydronephrosis: Secondary | ICD-10-CM | POA: Diagnosis not present

## 2024-04-11 DIAGNOSIS — N9489 Other specified conditions associated with female genital organs and menstrual cycle: Secondary | ICD-10-CM | POA: Insufficient documentation

## 2024-04-11 DIAGNOSIS — N3941 Urge incontinence: Secondary | ICD-10-CM | POA: Diagnosis not present

## 2024-04-11 DIAGNOSIS — Z79899 Other long term (current) drug therapy: Secondary | ICD-10-CM | POA: Diagnosis not present

## 2024-04-11 LAB — COMPREHENSIVE METABOLIC PANEL WITH GFR
ALT: 33 U/L (ref 0–44)
AST: 25 U/L (ref 15–41)
Albumin: 4.3 g/dL (ref 3.5–5.0)
Alkaline Phosphatase: 59 U/L (ref 38–126)
Anion gap: 7 (ref 5–15)
BUN: 9 mg/dL (ref 6–20)
CO2: 25 mmol/L (ref 22–32)
Calcium: 9.6 mg/dL (ref 8.9–10.3)
Chloride: 104 mmol/L (ref 98–111)
Creatinine, Ser: 0.67 mg/dL (ref 0.44–1.00)
GFR, Estimated: >60 mL/min (ref >60)
Glucose, Bld: 97 mg/dL (ref 70–99)
Potassium: 4 mmol/L (ref 3.5–5.1)
Sodium: 136 mmol/L (ref 135–145)
Total Bilirubin: 1.1 mg/dL (ref 0.0–1.2)
Total Protein: 7.5 g/dL (ref 6.5–8.1)

## 2024-04-11 LAB — CBC WITH DIFFERENTIAL/PLATELET
Abs Immature Granulocytes: 0.03 K/uL (ref 0.00–0.07)
Basophils Absolute: 0 K/uL (ref 0.0–0.1)
Basophils Relative: 0 %
Eosinophils Absolute: 0.1 K/uL (ref 0.0–0.5)
Eosinophils Relative: 1 %
HCT: 41.3 % (ref 36.0–46.0)
Hemoglobin: 14.2 g/dL (ref 12.0–15.0)
Immature Granulocytes: 0 %
Lymphocytes Relative: 19 %
Lymphs Abs: 1.7 K/uL (ref 0.7–4.0)
MCH: 29.4 pg (ref 26.0–34.0)
MCHC: 34.4 g/dL (ref 30.0–36.0)
MCV: 85.5 fL (ref 80.0–100.0)
Monocytes Absolute: 0.4 K/uL (ref 0.1–1.0)
Monocytes Relative: 5 %
Neutro Abs: 6.8 K/uL (ref 1.7–7.7)
Neutrophils Relative %: 75 %
Platelets: 241 K/uL (ref 150–400)
RBC: 4.83 MIL/uL (ref 3.87–5.11)
RDW: 12.3 % (ref 11.5–15.5)
WBC: 9.1 K/uL (ref 4.0–10.5)
nRBC: 0 % (ref 0.0–0.2)

## 2024-04-11 NOTE — Patient Instructions (Signed)
 Uterine Tissue Growing Outside the Uterus (Endometriosis): What to Know Endometriosis is a condition in which the tissue that forms the lining of the uterus grows in places outside the uterus. This tissue can grow in: The organs that store the eggs (ovaries). The tubes that carry the eggs to the uterus (fallopian tubes). The vagina. The bowel. This tissue most often grows on the ovaries and on the lining of the pelvic cavity. What are the causes? The cause of this condition is not known. What increases the risk? Having a family history of endometriosis. Having never given birth. Starting your period at age 83 or younger. What are the signs or symptoms? Often, there are no symptoms of this condition. If you do have symptoms, they may include: Heavy bleeding during your period. Periods that happen more than once a month. Not being able to get pregnant. Pain. You may feel pain: Between your hip bones (pelvis). In your back. In your belly. During sex. When you poop or pee during your period. Rarely, you may see blood in your poop or pee. Depending on where the changed tissue is growing, symptoms may: Happen during your period (most often) or at the middle of your cycle. Come and go. You may have no symptoms during some months. Stop when you no longer have your periods (menopause). How is this diagnosed? This condition is diagnosed based on your symptoms and a physical exam. You may also have tests, such as: Blood and pee tests to help rule out other causes. Ultrasound to look for tissues that are not normal. X-ray of the lower bowel. CT scan. MRI. To confirm the diagnosis, your health care provider may: Use a device with a light and camera to check tissue inside your belly (laparoscopy). Take out changed tissue for testing in a lab (biopsy). How is this treated? There's no cure for this condition. The treatment aims to control your symptoms. The type of treatment depends on  whether you want to become pregnant in the future. This condition may be treated with: Pain medicines. These include NSAIDs, such as ibuprofen. Hormones, such as birth control pills, to slow the growth of changed tissue. Surgery to remove the changed tissue. Tissue may be removed using a laparoscope and a laser. The ovaries, fallopian tubes, and uterus may be removed (hysterectomy). This is done in very severe cases. Follow these instructions at home: Take your medicines only as told. Where to find more information [employer]: acog.org Office on [employer]: [employer] Contact a health care provider if: You have irregular periods. You have problems getting pregnant. You have pain in your belly on the lower right side. Get help right away if: You have very bad pain that does not get better with medicine. You have a high fever. You have very bad throwing up or a feeling that you may throw up. You have bloating and swelling in your belly. You have pain when you poop, or you notice blood in your poop. This information is not intended to replace advice given to you by your health care provider. Make sure you discuss any questions you have with your health care provider. Document Revised: 12/13/2022 Document Reviewed: 12/13/2022 Elsevier Patient Education  2024 [employer].

## 2024-04-11 NOTE — Progress Notes (Signed)
 Gynecologic Oncology Consult Visit   Referring Provider: Bernarda Schroeder, PA  Chief Complaint: Pelvic Masses  Subjective:  Maria Drake is a 43 y.o. G1p1 female who is seen in consultation from Dr. Bernarda Schroeder, PA for evaluation and management of pelvic masses. Known h/o PCOS, endometriosis, and infertility.   Patient presented for her annual exam and reported intermittent pelvic pain November 2024.  Described as dull constant ache few days per week and resolves.  She complained of urinary urgency and incontinence.  03/08/2024-Pap- NILM, HPV Negative  04/02/2024-ultrasound pelvis transvaginal  Indications:Pelvic pain/Enlarged Uterus. Findings: The uterus is anteverted and measures 9.2 x 4.2 x 4.2 cm with a uterine volume of 85.84 ml. Echo texture is homogenous without evidence of focal masses. The Endometrium measures 3.4 mm. Right Ovary was not identified due to large complex mass seen in right adnexa measuring: 13.77 x 8.0 x 12.1 cm.  Left Ovary was not identified due to mass seen in left adnexa measuring: 10.5 x 7.8 x 10.9 cm. Unable to visualize any ovarian tissue on either side. There is no free fluid in the cul de sac. Impression: 1. No uterine fibroid was seen. 2. Large complex masses were noted in either adnexa,most likely extending off of ovaries.  04/06/2024-CT abdomen pelvis with contrast FINDINGS: - Lower chest: Small hiatal hernia. - Hepatobiliary: Diffuse hepatic steatosis. Subtle ill-defined hypodense areas in the left lobe of the liver measure 8 mm on image 11/2 and 14 mm on image 13/2. Gallbladder surgically absent. No biliary ductal dilation. - Pancreas: No pancreatic ductal dilation or evidence of acute inflammation. - Spleen: No splenomegaly. - Adrenals/Urinary Tract: Bilateral adrenal glands appear normal. Mild bilateral hydroureteronephrosis to the pelvic inlet where the ureters are compressed by large pelvic lesions. Urinary bladder is minimally  distended. - Stomach/Bowel: Stomach is unremarkable for degree of distension. No pathologic dilation of small or large bowel. - Vascular/Lymphatic: Normal caliber abdominal aorta. Smooth IVC contours. The portal, splenic and superior mesenteric veins are patent. No pathologically enlarged abdominal or pelvic lymph nodes. - Reproductive: Complex cystic pelvic lesions arising from the right adnexa measure 13.8 x 9.6 cm and the left adnexa measures 10.2 x 9.1 cm - Other: No discrete peritoneal or omental nodularity no significant abdominal or pelvic free fluid. - Musculoskeletal: No aggressive lytic or blastic lesion of bone. Degenerative change of the bilateral hips.   IMPRESSION: 1. Complex cystic pelvic lesions arising from the right adnexa measure 13.8 x 9.6 cm and the left adnexa measures 10.2 x 9.1 cm, compatible with ovarian neoplasm. 2. Mild bilateral hydroureteronephrosis to the pelvic inlet where the ureters are compressed by the large pelvic lesions.  3. Subtle ill-defined hypodense areas in the left lobe of the liver measure 8 mm and 14 mm, nonspecific but suspicious for metastatic disease. Consider more definitive characterization by hepatic protocol MRI with and without contrast. 4. Diffuse hepatic steatosis. 5. Small hiatal hernia.     CA 125- 68.3 (04/04/2024)   She has a history of PCOS/endometriosis on colon, diagnosed on laparoscopy at age 65, previously on Lupron and OCPs in the past.  It sounds like the Lupron was given for 6 months. She was able to conceive using IUI and has one child. She is not on contraception at this time.  Did not wasn't to do further fertility treatments and she has been trying to conceive for ~ 3 years. They are potentially still interested in fertility but resigned to not having further children given the challenges with  prior obstetrical issues (GDM and pregnancy induced hypertension) in addition there son had ventriculomegaly. Fortunately he is doing  well. Some pelvic discomfort during intercourse.  No family history of breast or ovarian cancer.  Previously mentioned liver lesion has been present on imaging since 2019. Findings consistent with focal nodule hyperplasia.   Problem List: Patient Active Problem List   Diagnosis Date Noted   Adnexal mass 04/11/2024   Calculus of gallbladder without cholecystitis without obstruction 09/03/2021   History of gestational hypertension 11/07/2019   Family history of congenital hydrocephalus 11/07/2019   History of gestational diabetes 05/17/2018   Lactation disorder 05/17/2018   History of paroxysmal atrial tachycardia 02/22/2018   Hypothyroidism 09/27/2017   Endometriosis of pelvis 03/17/2016   Rhinitis, allergic 03/17/2016   Oligo-ovulation 03/17/2016   Acne 03/17/2016   PCOS (polycystic ovarian syndrome) 03/17/2016   Anxiety 03/17/2016    Past Medical History: Past Medical History:  Diagnosis Date   Endometrial disorder    Endometriosis    Environmental allergies    Gestational diabetes    Gestational hypertension    Headache    Hyperthyroidism    PCOS (polycystic ovarian syndrome)    Tachycardia     Past Surgical History: Past Surgical History:  Procedure Laterality Date   CESAREAN SECTION     CHOLECYSTECTOMY     LAPAROSCOPIC ENDOMETRIOSIS FULGURATION     ROBOTIC ASSISTED LAPAROSCOPIC CHOLECYSTECTOMY-MULTI SITE N/A 06/22/2018   Procedure: ROBOTIC ASSISTED LAPAROSCOPIC CHOLECYSTECTOMY-MULTI SITE;  Surgeon: Jordis Laneta FALCON, MD;  Location: [facility] ORS;  Service: General;  Laterality: N/A;   WISDOM TOOTH EXTRACTION      Past Gynecologic History:  Menarche: 13 or 14 Menstrual details: Lasts 3-5 days Menses regular: yes History of OCP/HRT use: yes History of Abnormal pap: Uncertain Last Pap 8/20225 NILM and HRHPV negative Contraception: none at this time Sexually active: Yes  OB History:  OB History  Gravida Para Term Preterm AB Living  1 1 1   1   SAB IAB Ectopic  Multiple Live Births      1    # Outcome Date GA Lbr Len/2nd Weight Sex Type Anes PTL Lv  1 Term 03/27/18 [redacted]w[redacted]d  8 lb 1.6 oz (3.675 kg) M CS-LTranv Spinal N LIV     Birth Comments: fetal hydrocephalus     Complications: Hydrocephalus, Gestational hypertension, Gestational diabetes    Family History: Family History  Problem Relation Age of Onset   Diabetes Mother    Heart disease Father    Diabetes Paternal Uncle    Diabetes Maternal Grandfather    Diabetes Paternal Grandfather    Heart disease Paternal Grandfather    Breast cancer Neg Hx    Ovarian cancer Neg Hx     Social History: Social History   Socioeconomic History   Marital status: Married    Spouse name: Not on file   Number of children: 1   Years of education: Not on file   Highest education level: Not on file  Occupational History   Not on file  Tobacco Use   Smoking status: Never   Smokeless tobacco: Never  Vaping Use   Vaping status: Never Used  Substance and Sexual Activity   Alcohol use: Yes    Comment: once a year   Drug use: No   Sexual activity: Yes    Birth control/protection: None  Other Topics Concern   Not on file  Social History Narrative   She works as a Engineer, materials. Husband works in Warden/ranger  at [employer] heating & air. One son who is age 56 (44) with IVI.    Social Drivers of Corporate investment banker Strain: Not on file  Food Insecurity: No Food Insecurity (04/11/2024)   Hunger Vital Sign    Worried About Running Out of Food in the Last Year: Never true    Ran Out of Food in the Last Year: Never true  Transportation Needs: No Transportation Needs (04/11/2024)   PRAPARE - Administrator, Civil Service (Medical): No    Lack of Transportation (Non-Medical): No  Recent Concern: Transportation Needs - Unmet Transportation Needs (04/11/2024)   PRAPARE - Administrator, Civil Service (Medical): Yes    Lack of Transportation (Non-Medical): Yes  Physical  Activity: Not on file  Stress: Not on file  Social Connections: Not on file  Intimate Partner Violence: Not At Risk (04/11/2024)   Humiliation, Afraid, Rape, and Kick questionnaire    Fear of Current or Ex-Partner: No    Emotionally Abused: No    Physically Abused: No    Sexually Abused: No    Allergies: Allergies  Allergen Reactions   Decongest-Aid [Pseudoephedrine] Anaphylaxis    tachycardia   Codeine Nausea And Vomiting    Current Medications: Current Outpatient Medications  Medication Sig Dispense Refill   cetirizine (ZYRTEC) 10 MG tablet Take 10 mg by mouth daily.     clobetasol ointment (TEMOVATE) 0.05 %      Cranberry 50 MG CHEW Chew by mouth.     montelukast (SINGULAIR) 10 MG tablet Take 10 mg by mouth at bedtime.     Prenatal Vit-Fe Fumarate-FA (PRENATAL MULTIVITAMIN) TABS tablet Take 1 tablet by mouth daily at 12 noon.     Triamcinolone Acetonide (NASACORT AQ NA) Place into the nose.     UNABLE TO FIND Take 1 tablet by mouth daily. Med Name: plexus gut health supplement     No current facility-administered medications for this visit.   Review of Systems General: weight gain Skin: negative for changes in moles or sores or rash Eyes: negative for changes in vision HEENT: negative for change in hearing, tinnitus, voice changes Pulmonary: negative for dyspnea, orthopnea, productive cough, wheezing Cardiac: negative for palpitations, pain Gastrointestinal: negative for nausea, vomiting, constipation, diarrhea, hematemesis, hematochezia Genitourinary/Sexual: positive for incontinence, urgency Ob/Gyn:  negative for abnormal bleeding, or pain Musculoskeletal: negative for pain, joint pain, back pain Hematology: negative for easy bruising, abnormal bleeding Neurologic/Psych: negative for seizures, paralysis, weakness, numbness. Headaches around menses.    Objective:  Physical Examination:  BP (!) 161/98   Pulse (!) 104   Temp 98.6 F (37 C)   Resp 20   Wt 177 lb  14.4 oz (80.7 kg)   SpO2 100%   BMI 31.51 kg/m     ECOG Performance Status: 1 - Symptomatic but completely ambulatory  GENERAL: Patient is a well appearing female in no acute distress HEENT:  Sclerae anicteric.  Oropharynx clear and moist.  NODES:  No cervical, supraclavicular, or axillary lymphadenopathy palpated.  LUNGS:  Normal respiratory effort ABDOMEN:  Soft, nontender. Slightly distended. No ascites. No organomegaly palpated. Positive bilateral pelvic masses L >R ~ 12-14 cm.  EXTREMITIES:  No peripheral edema.   SKIN:  Clear with no obvious rashes or skin changes. No nail dyscrasia. NEURO:  Nonfocal. Well oriented.  Appropriate affect.  Pelvic: Exam Chaperoned by RN EGBUS: no lesions Cervix: no anterior cervical lesions, nontender, mobile. Pushed anteriorly by mass and unable to see  posterior aspect or os.  Vagina: no lesions, no discharge or bleeding Uterus: normal size, nontender, mobile Adnexa: bilateral palpable masses. The right fills the cul-de-sac completely 12-14 cm. Soft to palpation and smooth except for 1.5 cm nodular area cul-de-sac.  Rectovaginal: confirmatory. No mucosal involvement.   Lab Review Labs on site today: HE4, CBC, CMP pending  Radiologic Imaging: 03/29/2024 Mammogram DIGITAL SCREENING BILATERAL MAMMOGRAM WITH TOMOSYNTHESIS AND CAD   TECHNIQUE: Bilateral screening digital craniocaudal and mediolateral oblique mammograms were obtained. Bilateral screening digital breast tomosynthesis was performed. The images were evaluated with computer-aided detection.   COMPARISON:  None available.   ACR Breast Density Category b: There are scattered areas of fibroglandular density.   FINDINGS: There are no findings suspicious for malignancy.   IMPRESSION: No mammographic evidence of malignancy. A result letter of this screening mammogram will be mailed directly to the patient.   RECOMMENDATION: Screening mammogram in one year. (Code:SM-B-01Y)    BI-RADS CATEGORY  1: Negative.   04/06/2024 CT A/P EXAM: CT ABDOMEN AND PELVIS WITH CONTRAST   TECHNIQUE: Multidetector CT imaging of the abdomen and pelvis was performed using the standard protocol following bolus administration of intravenous contrast.   RADIATION DOSE REDUCTION: This exam was performed according to the departmental dose-optimization program which includes automated exposure control, adjustment of the mA and/or kV according to patient size and/or use of iterative reconstruction technique.   CONTRAST:  60mL OMNIPAQUE  IOHEXOL  350 MG/ML SOLN   COMPARISON:  Pelvic ultrasound April 02, 2024.   FINDINGS: Lower chest: Small hiatal hernia.   Hepatobiliary: Diffuse hepatic steatosis. Subtle ill-defined hypodense areas in the left lobe of the liver measure 8 mm on image 11/2 and 14 mm on image 13/2. Gallbladder surgically absent. No biliary ductal dilation.   Pancreas: No pancreatic ductal dilation or evidence of acute inflammation.   Spleen: No splenomegaly.   Adrenals/Urinary Tract: Bilateral adrenal glands appear normal. Mild bilateral hydroureteronephrosis to the pelvic inlet where the ureters are compressed by large pelvic lesions. Urinary bladder is minimally distended.   Stomach/Bowel: Stomach is unremarkable for degree of distension. No pathologic dilation of small or large bowel.   Vascular/Lymphatic: Normal caliber abdominal aorta. Smooth IVC contours. The portal, splenic and superior mesenteric veins are patent. No pathologically enlarged abdominal or pelvic lymph nodes.   Reproductive: Complex cystic pelvic lesions arising from the right adnexa measure 13.8 x 9.6 cm and the left adnexa measures 10.2 x 9.1 cm   Other: No discrete peritoneal or omental nodularity no significant abdominal or pelvic free fluid.   Musculoskeletal: No aggressive lytic or blastic lesion of bone. Degenerative change of the bilateral hips.   IMPRESSION: 1.  Complex cystic pelvic lesions arising from the right adnexa measure 13.8 x 9.6 cm and the left adnexa measures 10.2 x 9.1 cm, compatible with ovarian neoplasm. 2. Mild bilateral hydroureteronephrosis to the pelvic inlet where the ureters are compressed by the large pelvic lesions. 3. Subtle ill-defined hypodense areas in the left lobe of the liver measure 8 mm and 14 mm, nonspecific but suspicious for metastatic disease. Consider more definitive characterization by hepatic protocol MRI with and without contrast. 4. Diffuse hepatic steatosis. 5. Small hiatal hernia.       Assessment:  Maria Drake is a 43 y.o. female diagnosed with complex cystic pelvic lesions arising from the right adnexa measure 13.8 x 9.6 cm and the left adnexa measures 10.2 x 9.1 cm with slightly elevated CA125 in the setting of PCOS, known endometriosis diagnosed  by laparoscopy, and infertility.   Future fertility desires, still under consideration.   Mild bilateral hydroureteronephrosis due to compression from bilateral adnexal masses.   Subtle ill-defined hypodense areas in the left lobe of the liver most likely focal nodular hyperplasia based on prior imaging.   Medical co-morbidities complicating care: Multiple prior surgery including c-section, laparoscopic chole and laparoscopy for endometriosis.   Problem List Items Addressed This Visit       Other   Adnexal mass - Primary   Relevant Orders   Human Epididymis Prot 4,Serial   CBC with Differential/Platelet (Completed)   Comprehensive metabolic panel with GFR (Completed)   Other Visit Diagnoses       Elevated CA-125       Relevant Orders   Human Epididymis Prot 4,Serial   CBC with Differential/Platelet (Completed)   Comprehensive metabolic panel with GFR (Completed)     Endometriosis           We discussed options for management including surgery at [facility]. Plan for conservative surgical approach for diagnostic and therapeutic approaches.  Bilateral cystectomy with ablation of endometriosis and bilateral stent placement. Possible TH, BSO, omentectomy, staging and nodal assessment based on intraoperative pathology assessment. She is ok to proceed with unilateral oophorectomy; and bilateral oophorectomy if absolutely needed.   Obtain HE4, CBC, CMP today.   Suggested return to clinic in  4 - 6 weeks after surgery.     The patient's diagnosis, an outline of the further diagnostic and laboratory studies which will be required, the recommendation for surgery, and alternatives were discussed with her and her accompanying family members.  All questions were answered to their satisfaction.  A total of 90 minutes were spent with the patient/family today; >50% was spent in education, counseling and coordination of care for bilateral adnexal masses.   Tinnie Dawn, DNP, AGNP-C, AOCNP [facility] [phone] (clinic)  I personally had a face to face interaction and evaluated the patient jointly with the NP, Ms. Tinnie Dawn.  I have reviewed her history and available records and have performed the key portions of the physical exam including lymph node survey, abdominal exam, pelvic exam with my findings confirming those documented above by the APP.  I have discussed the case with the APP and the patient.  I agree with the above documentation, assessment and plan which was fully formulated by me.  Counseling was completed by me.   I personally saw the patient and performed a substantive portion of this encounter in conjunction with the listed APP as documented above.  Delsy Etzkorn Isidor Constable, MD   CC:  Referring Provider: Bernarda Schroeder, PA

## 2024-04-12 LAB — HUMAN EPIDIDYMIS PROT 4,SERIAL: HE4: 220 pmol/L — ABNORMAL HIGH (ref 0.0–63.6)

## 2024-04-17 DIAGNOSIS — Z01818 Encounter for other preprocedural examination: Secondary | ICD-10-CM | POA: Diagnosis not present

## 2024-04-17 DIAGNOSIS — R1900 Intra-abdominal and pelvic swelling, mass and lump, unspecified site: Secondary | ICD-10-CM | POA: Diagnosis not present

## 2024-04-17 DIAGNOSIS — N6320 Unspecified lump in the left breast, unspecified quadrant: Secondary | ICD-10-CM | POA: Diagnosis not present

## 2024-04-17 DIAGNOSIS — Z7189 Other specified counseling: Secondary | ICD-10-CM | POA: Diagnosis not present

## 2024-04-17 DIAGNOSIS — R971 Elevated cancer antigen 125 [CA 125]: Secondary | ICD-10-CM | POA: Diagnosis not present

## 2024-04-17 DIAGNOSIS — Z23 Encounter for immunization: Secondary | ICD-10-CM | POA: Diagnosis not present

## 2024-04-19 DIAGNOSIS — Z01818 Encounter for other preprocedural examination: Secondary | ICD-10-CM | POA: Diagnosis not present

## 2024-04-19 DIAGNOSIS — R1900 Intra-abdominal and pelvic swelling, mass and lump, unspecified site: Secondary | ICD-10-CM | POA: Diagnosis not present

## 2024-04-20 DIAGNOSIS — E282 Polycystic ovarian syndrome: Secondary | ICD-10-CM | POA: Diagnosis not present

## 2024-04-20 DIAGNOSIS — N80399 Endometriosis of the pelvic peritoneum, other specified sites, unspecified depth: Secondary | ICD-10-CM | POA: Diagnosis not present

## 2024-04-20 DIAGNOSIS — N979 Female infertility, unspecified: Secondary | ICD-10-CM | POA: Diagnosis not present

## 2024-04-20 DIAGNOSIS — Z7984 Long term (current) use of oral hypoglycemic drugs: Secondary | ICD-10-CM | POA: Diagnosis not present

## 2024-04-20 DIAGNOSIS — E039 Hypothyroidism, unspecified: Secondary | ICD-10-CM | POA: Diagnosis not present

## 2024-04-20 DIAGNOSIS — N3941 Urge incontinence: Secondary | ICD-10-CM | POA: Diagnosis not present

## 2024-04-20 DIAGNOSIS — Z9049 Acquired absence of other specified parts of digestive tract: Secondary | ICD-10-CM | POA: Diagnosis not present

## 2024-04-20 DIAGNOSIS — Z885 Allergy status to narcotic agent status: Secondary | ICD-10-CM | POA: Diagnosis not present

## 2024-04-20 DIAGNOSIS — Z888 Allergy status to other drugs, medicaments and biological substances status: Secondary | ICD-10-CM | POA: Diagnosis not present

## 2024-04-20 DIAGNOSIS — N736 Female pelvic peritoneal adhesions (postinfective): Secondary | ICD-10-CM | POA: Diagnosis not present

## 2024-04-20 DIAGNOSIS — N1330 Unspecified hydronephrosis: Secondary | ICD-10-CM | POA: Diagnosis not present

## 2024-04-20 DIAGNOSIS — D4959 Neoplasm of unspecified behavior of other genitourinary organ: Secondary | ICD-10-CM | POA: Diagnosis not present

## 2024-04-20 DIAGNOSIS — N838 Other noninflammatory disorders of ovary, fallopian tube and broad ligament: Secondary | ICD-10-CM | POA: Diagnosis not present

## 2024-04-20 DIAGNOSIS — D3912 Neoplasm of uncertain behavior of left ovary: Secondary | ICD-10-CM | POA: Diagnosis not present

## 2024-04-21 DIAGNOSIS — N3941 Urge incontinence: Secondary | ICD-10-CM | POA: Diagnosis not present

## 2024-04-21 DIAGNOSIS — Z885 Allergy status to narcotic agent status: Secondary | ICD-10-CM | POA: Diagnosis not present

## 2024-04-21 DIAGNOSIS — Z7984 Long term (current) use of oral hypoglycemic drugs: Secondary | ICD-10-CM | POA: Diagnosis not present

## 2024-04-21 DIAGNOSIS — N80399 Endometriosis of the pelvic peritoneum, other specified sites, unspecified depth: Secondary | ICD-10-CM | POA: Diagnosis not present

## 2024-04-21 DIAGNOSIS — E282 Polycystic ovarian syndrome: Secondary | ICD-10-CM | POA: Diagnosis not present

## 2024-04-21 DIAGNOSIS — N979 Female infertility, unspecified: Secondary | ICD-10-CM | POA: Diagnosis not present

## 2024-04-21 DIAGNOSIS — D4959 Neoplasm of unspecified behavior of other genitourinary organ: Secondary | ICD-10-CM | POA: Diagnosis not present

## 2024-04-21 DIAGNOSIS — N736 Female pelvic peritoneal adhesions (postinfective): Secondary | ICD-10-CM | POA: Diagnosis not present

## 2024-04-21 DIAGNOSIS — E039 Hypothyroidism, unspecified: Secondary | ICD-10-CM | POA: Diagnosis not present

## 2024-04-21 DIAGNOSIS — N1330 Unspecified hydronephrosis: Secondary | ICD-10-CM | POA: Diagnosis not present

## 2024-04-21 DIAGNOSIS — Z888 Allergy status to other drugs, medicaments and biological substances status: Secondary | ICD-10-CM | POA: Diagnosis not present

## 2024-04-21 DIAGNOSIS — N838 Other noninflammatory disorders of ovary, fallopian tube and broad ligament: Secondary | ICD-10-CM | POA: Diagnosis not present

## 2024-05-11 ENCOUNTER — Other Ambulatory Visit: Payer: Self-pay | Admitting: Physician Assistant

## 2024-05-11 DIAGNOSIS — R1900 Intra-abdominal and pelvic swelling, mass and lump, unspecified site: Secondary | ICD-10-CM

## 2024-05-11 DIAGNOSIS — Z7189 Other specified counseling: Secondary | ICD-10-CM | POA: Diagnosis not present

## 2024-05-21 ENCOUNTER — Ambulatory Visit

## 2024-05-25 ENCOUNTER — Telehealth: Payer: Self-pay

## 2024-05-25 NOTE — Telephone Encounter (Signed)
 Received call from [name] regarding CT scan. Insurance is not approving scan. She went in for a visit 10/31 due to abdominal and back pain. A CT was ordered with the option to cancel if symptoms improve. Her symptoms have improved and she is feeling much better. We can cancel CT at [facility] and she will keep the one at [facility] for now until she sees Dr. Elby 11/19 in follow up. [facility] team notified.

## 2024-05-28 ENCOUNTER — Ambulatory Visit

## 2024-05-30 ENCOUNTER — Inpatient Hospital Stay: Attending: Obstetrics and Gynecology | Admitting: Obstetrics and Gynecology

## 2024-05-30 VITALS — BP 120/85 | HR 88 | Temp 97.9°F | Ht 63.0 in | Wt 180.2 lb

## 2024-05-30 DIAGNOSIS — Z90721 Acquired absence of ovaries, unilateral: Secondary | ICD-10-CM | POA: Insufficient documentation

## 2024-05-30 DIAGNOSIS — D3910 Neoplasm of uncertain behavior of unspecified ovary: Secondary | ICD-10-CM

## 2024-05-30 DIAGNOSIS — N898 Other specified noninflammatory disorders of vagina: Secondary | ICD-10-CM | POA: Diagnosis not present

## 2024-05-30 DIAGNOSIS — D271 Benign neoplasm of left ovary: Secondary | ICD-10-CM | POA: Diagnosis not present

## 2024-05-30 DIAGNOSIS — N809 Endometriosis, unspecified: Secondary | ICD-10-CM | POA: Diagnosis not present

## 2024-05-30 LAB — WET PREP, GENITAL
Clue Cells Wet Prep HPF POC: NONE SEEN
Sperm: NONE SEEN
Trich, Wet Prep: NONE SEEN
WBC, Wet Prep HPF POC: <10 (ref <10)
Yeast Wet Prep HPF POC: NONE SEEN

## 2024-05-30 NOTE — Progress Notes (Signed)
 Gynecologic Oncology Interval Visit   Referring Provider: Bernarda Schroeder, PA  Chief Complaint: Ovarian borderline tumor  Subjective:  Maria Drake is a 43 y.o. G1p1 female who is seen in consultation from Dr. Bernarda Schroeder, PA for evaluation and management of pelvic masses. Known h/o PCOS, endometriosis, and infertility.   04/20/2024 She underwent Pelvic exam under anesthesia, Diagnostic laparoscopy, Laparoscopic lysis of adhesions, Mini-laparotomy, Controlled drainage of the mass, Laparoscopic left salpingo-oophorectomy with hand-assist, Pelvic washings, Omentectomy and Peritoneal biopsies for left ovarian mass and pelvic adhesive disease.    I contacted with the pathology results from surgery. Final pathology revealed borderline ovarian tumor. Tumor Board recommendations are pending.  Overall she is doing well after surgery.  She does complain of abdominal pain that is improving as well as vaginal discharge.  She did reach out to [facility] regarding her symptoms of abdominal pain postoperatively.  Reassurance was provided and they ordered a CT scan.  However given that her pain is improved I think we can cancel the CT scan   Case Report  Surgical Pathology                                Case: [id number]                                Authorizing Provider:  Elby Webb Loges,   Collected:           04/20/2024 1558                                     MD                                                                          Ordering Location:     [facility]          Received:            04/20/2024 1612              Pathologist:           Vinie Cotton, MD                                                      Intraop:               Edmon Franky Hasten, MD                                                  Specimens:   A) - Tissue, left ovary and tube,  right ovarian cyst  B) - Tissue, posterior cul-de-sac                                                                    C) - Tissue, omentum                                                                               D) - Tissue, right  paracolic peritoneal biopsy                                                     E) - Tissue, left  paracolic peritoneal biopsy                                                     F) - Tissue, anterior peritoneal biopsy                                                             G) - Tissue, left pelvic peritoneal biopsy                                                         H) - Tissue, right pelvic peritoneal biopsy                                               DIAGNOSIS  A. Left ovary and fallopian tube, salpingo-oophorectomy:   Ovary: Serous borderline tumor, see comment and synoptic report.   Fallopian tube: No significant histopathologic change.   B. Posterior cul-de-sac, biopsy:   Benign fibromuscular tissue. Negative for malignancy.   C. Omentum, resection:   Benign fibroadipose tissue. Negative for malignancy.   D. Right paracolic peritoneum, biopsy:   Benign fibrous tissue. Negative for malignancy.   E. Left paracolic peritoneum, biopsy:   Benign fibroadipose tissue. Negative for malignancy.   F. Anterior peritoneum, biopsy:   Benign fibroadipose tissue. Negative for malignancy.   G. Left pelvic peritoneum, biopsy:   Benign fibroadipose tissue. Negative for malignancy.   H. Right pelvic peritoneum, biopsy:   Benign fibrous tissue. Negative for malignancy.      Diagnostic Interpretation A  NEGATIVE. NO EVIDENCE OF MALIGNANCY.  Benign mesothelial cells and histiocytes in a background of peripheral blood elements.         Gynecologic Oncology History Patient presented for her annual exam and reported intermittent pelvic pain November 2024.  Described as dull constant ache few days per week and resolves.  She complained of urinary urgency and incontinence.  03/08/2024-Pap- NILM, HPV  Negative  04/02/2024-ultrasound pelvis transvaginal  Indications:Pelvic pain/Enlarged Uterus. Findings: The uterus is anteverted and measures 9.2 x 4.2 x 4.2 cm with a uterine volume of 85.84 ml. Echo texture is homogenous without evidence of focal masses. The Endometrium measures 3.4 mm. Right Ovary was not identified due to large complex mass seen in right adnexa measuring: 13.77 x 8.0 x 12.1 cm.  Left Ovary was not identified due to mass seen in left adnexa measuring: 10.5 x 7.8 x 10.9 cm. Unable to visualize any ovarian tissue on either side. There is no free fluid in the cul de sac. Impression: 1. No uterine fibroid was seen. 2. Large complex masses were noted in either adnexa,most likely extending off of ovaries.  04/06/2024-CT abdomen pelvis with contrast FINDINGS: - Lower chest: Small hiatal hernia. - Hepatobiliary: Diffuse hepatic steatosis. Subtle ill-defined hypodense areas in the left lobe of the liver measure 8 mm on image 11/2 and 14 mm on image 13/2. Gallbladder surgically absent. No biliary ductal dilation. - Pancreas: No pancreatic ductal dilation or evidence of acute inflammation. - Spleen: No splenomegaly. - Adrenals/Urinary Tract: Bilateral adrenal glands appear normal. Mild bilateral hydroureteronephrosis to the pelvic inlet where the ureters are compressed by large pelvic lesions. Urinary bladder is minimally distended. - Stomach/Bowel: Stomach is unremarkable for degree of distension. No pathologic dilation of small or large bowel. - Vascular/Lymphatic: Normal caliber abdominal aorta. Smooth IVC contours. The portal, splenic and superior mesenteric veins are patent. No pathologically enlarged abdominal or pelvic lymph nodes. - Reproductive: Complex cystic pelvic lesions arising from the right adnexa measure 13.8 x 9.6 cm and the left adnexa measures 10.2 x 9.1 cm - Other: No discrete peritoneal or omental nodularity no significant abdominal or pelvic free fluid. -  Musculoskeletal: No aggressive lytic or blastic lesion of bone. Degenerative change of the bilateral hips.   IMPRESSION: 1. Complex cystic pelvic lesions arising from the right adnexa measure 13.8 x 9.6 cm and the left adnexa measures 10.2 x 9.1 cm, compatible with ovarian neoplasm. 2. Mild bilateral hydroureteronephrosis to the pelvic inlet where the ureters are compressed by the large pelvic lesions.  3. Subtle ill-defined hypodense areas in the left lobe of the liver measure 8 mm and 14 mm, nonspecific but suspicious for metastatic disease. Consider more definitive characterization by hepatic protocol MRI with and without contrast. 4. Diffuse hepatic steatosis. 5. Small hiatal hernia.    CT ADDENDUM  -the 14 mm segment II hepatic lesion on image 13/2 corresponds within arterial enhancing 3.2 cm lesion on MRI Nov 28, 2020 this demonstrated hepatobiliary contrast uptake on 20 minute delay compatible with focal nodular hyperplasia.   -there is no definite lesion identified on prior examination to correspond with the segment IV/VIII hepatic lesion measuring 8 mm on image 11/2.   -on prior MRI there was an arterially enhancing 2 cm lesion in central segment IV which also demonstrated uptake on delayed imaging this lesion is not seen on today's CT.  CA 125- 68.3 (04/04/2024)   She has a history of PCOS/endometriosis on colon, diagnosed on laparoscopy at age 46, previously on Lupron and OCPs in the past.  It sounds like the Lupron was given for 6 months. She was able to conceive using IUI and has one child. She is not on contraception at this time.  Did not wasn't to do further fertility treatments and she has been trying to conceive for ~ 3 years. They are potentially still interested in fertility but resigned to not having further children given the challenges with prior obstetrical issues (GDM and pregnancy induced hypertension) in addition there son had ventriculomegaly. Fortunately he is  doing well. Some pelvic discomfort during intercourse.  No family history of breast or ovarian cancer.  Previously mentioned liver lesion has been present on imaging since 2019. Findings consistent with focal nodule hyperplasia.   Problem List: Patient Active Problem List   Diagnosis Date Noted   Adnexal mass 04/11/2024   Calculus of gallbladder without cholecystitis without obstruction 09/03/2021   History of gestational hypertension 11/07/2019   Family history of congenital hydrocephalus 11/07/2019   History of gestational diabetes 05/17/2018   Lactation disorder 05/17/2018   History of paroxysmal atrial tachycardia 02/22/2018   Hypothyroidism 09/27/2017   Endometriosis of pelvis 03/17/2016   Rhinitis, allergic 03/17/2016   Oligo-ovulation 03/17/2016   Acne 03/17/2016   PCOS (polycystic ovarian syndrome) 03/17/2016   Anxiety 03/17/2016    Past Medical History: Past Medical History:  Diagnosis Date   Endometrial disorder    Endometriosis    Environmental allergies    Gestational diabetes    Gestational hypertension    Headache    Hyperthyroidism    PCOS (polycystic ovarian syndrome)    Tachycardia     Past Surgical History: Past Surgical History:  Procedure Laterality Date   CESAREAN SECTION     CHOLECYSTECTOMY     LAPAROSCOPIC ENDOMETRIOSIS FULGURATION     ROBOTIC ASSISTED LAPAROSCOPIC CHOLECYSTECTOMY-MULTI SITE N/A 06/22/2018   Procedure: ROBOTIC ASSISTED LAPAROSCOPIC CHOLECYSTECTOMY-MULTI SITE;  Surgeon: Jordis Laneta FALCON, MD;  Location: [facility] ORS;  Service: General;  Laterality: N/A;   WISDOM TOOTH EXTRACTION      Past Gynecologic History:  Menarche: 13 or 14 Menstrual details: Lasts 3-5 days Menses regular: yes History of OCP/HRT use: yes History of Abnormal pap: Uncertain Last Pap 8/20225 NILM and HRHPV negative Contraception: none at this time Sexually active: Yes  OB History:  OB History  Gravida Para Term Preterm AB Living  1 1 1   1   SAB IAB  Ectopic Multiple Live Births      1    # Outcome Date GA Lbr Len/2nd Weight Sex Type Anes PTL Lv  1 Term 03/27/18 [redacted]w[redacted]d  8 lb 1.6 oz (3.675 kg) M CS-LTranv Spinal N LIV     Birth Comments: fetal hydrocephalus     Complications: Hydrocephalus, Gestational hypertension, Gestational diabetes    Family History: Family History  Problem Relation Age of Onset   Diabetes Mother    Heart disease Father    Diabetes Paternal Uncle    Diabetes Maternal Grandfather    Diabetes Paternal Grandfather    Heart disease Paternal Grandfather    Breast cancer Neg Hx    Ovarian cancer Neg Hx     Social History: Social History   Socioeconomic History   Marital status: Married    Spouse name: Not on file   Number of children: 1   Years of education: Not on file   Highest education level: Not on file  Occupational History   Not on file  Tobacco Use   Smoking status: Never   Smokeless tobacco: Never  Vaping Use   Vaping status: Never Used  Substance and Sexual Activity   Alcohol use: Yes    Comment: once a year   Drug use: No   Sexual activity: Yes    Birth control/protection: None  Other Topics Concern   Not on file  Social History Narrative   She works as a engineer, materials. Husband works in warden/ranger at [employer]. One son who is age 78 (44) with IVI.    Social Drivers of Corporate Investment Banker Strain: Low Risk  (04/20/2024)   Received from [facility]   Overall Financial Resource Strain (CARDIA)    Difficulty of Paying Living Expenses: Not hard at all  Food Insecurity: No Food Insecurity (04/20/2024)   Received from [facility]   Hunger Vital Sign    Within the past 12 months, you worried that your food would run out before you got the money to buy more.: Never true    Within the past 12 months, the food you bought just didn't last and you didn't have money to get more.: Never true  Transportation Needs: Unknown (04/20/2024)    Received from [facility] - Transportation    In the past 12 months, has lack of transportation kept you from medical appointments or from getting medications?: No    Lack of Transportation (Non-Medical): Not on file  Recent Concern: Transportation Needs - Unmet Transportation Needs (04/11/2024)   PRAPARE - Administrator, Civil Service (Medical): Yes    Lack of Transportation (Non-Medical): Yes  Physical Activity: Not on file  Stress: Not on file  Social Connections: Not on file  Intimate Partner Violence: Not At Risk (04/11/2024)   Humiliation, Afraid, Rape, and Kick questionnaire    Fear of Current or Ex-Partner: No    Emotionally Abused: No    Physically Abused: No    Sexually Abused: No    Allergies: Allergies  Allergen Reactions   Decongest-Aid [Pseudoephedrine] Anaphylaxis    tachycardia   Codeine Nausea And Vomiting    Current Medications: Current Outpatient Medications  Medication Sig Dispense Refill   cetirizine (ZYRTEC) 10 MG tablet Take 10 mg by mouth daily.     clobetasol ointment (TEMOVATE) 0.05 %      Cranberry 50 MG CHEW Chew by mouth.     montelukast (SINGULAIR) 10 MG tablet Take 10 mg by mouth at bedtime.     Triamcinolone Acetonide (NASACORT AQ NA) Place into the nose.     Prenatal Vit-Fe Fumarate-FA (PRENATAL MULTIVITAMIN) TABS tablet Take 1 tablet by mouth daily at 12 noon. (Patient not taking: Reported on 05/30/2024)     UNABLE TO FIND Take 1 tablet by mouth daily. Med Name: plexus gut health supplement     No current facility-administered medications for this visit.   Review of Systems As per interval history  Objective:  Physical Examination:  BP 120/85 (BP Location: Left Arm, Patient Position: Sitting) Comment: pt made aware to report readings over 140/90 to PCP  Pulse 88   Temp 97.9 F (36.6 C) (Tympanic)   Ht 5' 3 (1.6 m)   Wt 180 lb 3.2 oz (81.7 kg)   LMP 05/11/2024   SpO2 99%   BMI 31.92 kg/m      Performance status: 1  GENERAL: Patient is a well appearing female in no acute distress HEENT:  Atraumatic and normocephalic.  Chest: prior subcutaneous nodule below the left breast  was no longer palpated.  LUNGS: Normal respiratory rate ABDOMEN:  Soft, nontender. Nondistended. No masses/ascites/hernia/or hepatomegaly.  EXTREMITIES:  No peripheral edema.   SKIN: Incisions are well-healed.  There is no evidence of drainage or infection. NEURO:  Nonfocal. Well oriented.  Appropriate affect.  Pelvic: EGBUS: no lesions Cervix: Normal in appearance without gross lesions Vagina: no lesions, minimal white discharge; no bleeding.  Wet Prep performed BME: Deferred today.  Prior exam Adnexa: bilateral palpable masses. The right fills the cul-de-sac completely 12-14 cm. Soft to palpation and smooth except for 1.5 cm nodular area cul-de-sac.  Lab Review Lab Results  Component Value Date   WBC 9.1 04/11/2024   HGB 14.2 04/11/2024   HCT 41.3 04/11/2024   MCV 85.5 04/11/2024   PLT 241 04/11/2024     Chemistry      Component Value Date/Time   NA 136 04/11/2024 1035   NA 141 12/17/2021 1515   K 4.0 04/11/2024 1035   CL 104 04/11/2024 1035   CO2 25 04/11/2024 1035   BUN 9 04/11/2024 1035   BUN 10 12/17/2021 1515   CREATININE 0.67 04/11/2024 1035      Component Value Date/Time   CALCIUM 9.6 04/11/2024 1035   ALKPHOS 59 04/11/2024 1035   AST 25 04/11/2024 1035   ALT 33 04/11/2024 1035   BILITOT 1.1 04/11/2024 1035   BILITOT 0.3 12/17/2021 1515     04/04/2024 Cancer Antigen (CA) 125 68.3 High    04/11/2024 HE4 220.0 High      Radiologic Imaging: 04/06/2024 CT A/P EXAM: CT ABDOMEN AND PELVIS WITH CONTRAST   TECHNIQUE: Multidetector CT imaging of the abdomen and pelvis was performed using the standard protocol following bolus administration of intravenous contrast.   RADIATION DOSE REDUCTION: This exam was performed according to the departmental dose-optimization  program which includes automated exposure control, adjustment of the mA and/or kV according to patient size and/or use of iterative reconstruction technique.   CONTRAST:  60mL OMNIPAQUE  IOHEXOL  350 MG/ML SOLN   COMPARISON:  Pelvic ultrasound April 02, 2024.   FINDINGS: Lower chest: Small hiatal hernia.   Hepatobiliary: Diffuse hepatic steatosis. Subtle ill-defined hypodense areas in the left lobe of the liver measure 8 mm on image 11/2 and 14 mm on image 13/2. Gallbladder surgically absent. No biliary ductal dilation.   Pancreas: No pancreatic ductal dilation or evidence of acute inflammation.   Spleen: No splenomegaly.   Adrenals/Urinary Tract: Bilateral adrenal glands appear normal. Mild bilateral hydroureteronephrosis to the pelvic inlet where the ureters are compressed by large pelvic lesions. Urinary bladder is minimally distended.   Stomach/Bowel: Stomach is unremarkable for degree of distension. No pathologic dilation of small or large bowel.   Vascular/Lymphatic: Normal caliber abdominal aorta. Smooth IVC contours. The portal, splenic and superior mesenteric veins are patent. No pathologically enlarged abdominal or pelvic lymph nodes.   Reproductive: Complex cystic pelvic lesions arising from the right adnexa measure 13.8 x 9.6 cm and the left adnexa measures 10.2 x 9.1 cm   Other: No discrete peritoneal or omental nodularity no significant abdominal or pelvic free fluid.   Musculoskeletal: No aggressive lytic or blastic lesion of bone. Degenerative change of the bilateral hips.   IMPRESSION: 1. Complex cystic pelvic lesions arising from the right adnexa measure 13.8 x 9.6 cm and the left adnexa measures 10.2 x 9.1 cm, compatible with ovarian neoplasm. 2. Mild bilateral hydroureteronephrosis to the pelvic inlet where the ureters are compressed by the large pelvic lesions. 3. Subtle ill-defined  hypodense areas in the left lobe of the liver measure 8 mm and  14 mm, nonspecific but suspicious for metastatic disease. Consider more definitive characterization by hepatic protocol MRI with and without contrast. 4. Diffuse hepatic steatosis. 5. Small hiatal hernia.       Assessment:  Maria Drake is a 43 y.o. 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  Future fertility desires, still under consideration.   Subtle ill-defined hypodense areas in the left lobe of the liver most likely focal nodular hyperplasia based on prior imaging and CT review.   Vaginal discharge, of uncertain etiology.  Wet prep submitted  Left chest subcutaneous nodule now resolved.  Medical co-morbidities complicating care: Multiple prior surgery including c-section, laparoscopic chole and laparoscopy for endometriosis.   Problem List Items Addressed This Visit   None    We discussed options for management given her pathology results.  I have recommended active surveillance however would like to have a formal Tumor Board review for recommendations.  Once I have those results I can contact her with the final plan.  She will continue to follow-up with her gynecologic team.  Bernarda Schroeder is retiring and she will be reaching out to find another gynecologist to assist with her care.   Suggested return to clinic in 3 months for active surveillance.  After that time we can spread out her visits to every 6 months.  She will have a complete exam including a pelvic exam and CA125 at her surveillance  visits.  Begin to alternate her visits with her gynecologist after she establishes care.   We had lengthy discussions regarding the diagnosis of serous borderline tumors and how those differ from high-grade aggressive ovarian cancers.  We did discuss that while this she has an excellent prognosis continued active surveillance is needed because of the risk of recurrence including late recurrences.  When she completes her childbearing we can discuss next steps for completion of surgery.  She is very young though and may benefit from continued ovarian function until she enters menopause.   We also discussed that she is cleared to return to work but we did provide precautions regarding heavy lifting until she is 3 months out from her surgery.   Vaginal discharge, of uncertain etiology.  Wet prep submitted  Left chest subcutaneous nodule now resolved.  She will follow-up with her gynecologist or PCP if she noticed says this area returns.  She did have a mammogram in 03/2024 that was negative.  The patient's diagnosis, an outline of the further diagnostic and laboratory studies which will be required, were discussed with her and her husband.  All questions were answered to their satisfaction.  A total of 40 minutes were spent with the patient/family today; >50% was spent in education, counseling and coordination of care.  Sharlena Kristensen Isidor Constable, MD  ADDENDUM:  Kathrine prep findings are negative.  We will contact her with the results Component Ref Range & Units (hover) 09:00  Yeast Wet Prep HPF POC NONE SEEN  Trich, Wet Prep NONE SEEN  Clue Cells Wet Prep HPF POC NONE  SEEN  WBC, Wet Prep HPF POC <10  Sperm NONE SEEN  Sofie Schendel Isidor Constable, MD    CC:  Bernarda Schroeder, [place]

## 2024-05-30 NOTE — Patient Instructions (Signed)
 [facility] [phone]  Heather Verdon Garnette Leonce Demetra Schermerhorn

## 2024-06-05 ENCOUNTER — Telehealth: Payer: Self-pay

## 2024-06-05 NOTE — Telephone Encounter (Signed)
 [employer] disability forms completed and are pending physician signature.

## 2024-06-11 DIAGNOSIS — D225 Melanocytic nevi of trunk: Secondary | ICD-10-CM | POA: Diagnosis not present

## 2024-06-11 DIAGNOSIS — D2262 Melanocytic nevi of left upper limb, including shoulder: Secondary | ICD-10-CM | POA: Diagnosis not present

## 2024-06-11 DIAGNOSIS — D2261 Melanocytic nevi of right upper limb, including shoulder: Secondary | ICD-10-CM | POA: Diagnosis not present

## 2024-06-11 DIAGNOSIS — D2272 Melanocytic nevi of left lower limb, including hip: Secondary | ICD-10-CM | POA: Diagnosis not present

## 2024-06-24 ENCOUNTER — Encounter: Payer: Self-pay | Admitting: Emergency Medicine

## 2024-06-24 ENCOUNTER — Ambulatory Visit

## 2024-06-24 ENCOUNTER — Ambulatory Visit
Admission: EM | Admit: 2024-06-24 | Discharge: 2024-06-24 | Disposition: A | Discharge status: Home / Self Care | Source: Home / Self Care

## 2024-06-24 DIAGNOSIS — E282 Polycystic ovarian syndrome: Secondary | ICD-10-CM | POA: Diagnosis not present

## 2024-06-24 DIAGNOSIS — R059 Cough, unspecified: Secondary | ICD-10-CM | POA: Diagnosis not present

## 2024-06-24 DIAGNOSIS — J029 Acute pharyngitis, unspecified: Secondary | ICD-10-CM | POA: Diagnosis not present

## 2024-06-24 DIAGNOSIS — R051 Acute cough: Secondary | ICD-10-CM | POA: Diagnosis not present

## 2024-06-24 DIAGNOSIS — R030 Elevated blood-pressure reading, without diagnosis of hypertension: Secondary | ICD-10-CM

## 2024-06-24 LAB — POCT URINE PREGNANCY: Preg Test, Ur: NEGATIVE

## 2024-06-24 MED ORDER — BENZONATATE 100 MG PO CAPS: 100.0000 mg | ORAL_CAPSULE | Freq: Three times a day (TID) | ORAL | 0 refills | Status: AC

## 2024-06-24 MED ORDER — DOXYCYCLINE HYCLATE 100 MG PO CAPS: 100.0000 mg | ORAL_CAPSULE | Freq: Two times a day (BID) | ORAL | 0 refills | Status: AC

## 2024-06-24 NOTE — ED Triage Notes (Signed)
 Pt c/o cough, sputum productive, nasal congestion. Started about a week ago.  Pt states she is fairly certain she had the flu this week because her son was positive. Sore throat has resolved other than when she is coughing. Pt states she is over the flu symptoms but she is concerned about her cough.

## 2024-06-24 NOTE — Discharge Instructions (Addendum)
 We will cover you with doxycycline  for acute respiratory infection.  Wet read by this provider, please check MyChart for official radiology read.  Take antibiotic as prescribed, Tessalon  for cough, follow-up with PCP.

## 2024-06-24 NOTE — ED Provider Notes (Signed)
 [facility]    CSN: [id number] Arrival date & time: 06/24/24  1320      History   Chief Complaint Chief Complaint  Patient presents with   Cough    HPI Maria Drake is a 43 y.o. female.   43 year old female, Maria Drake, presents to urgent care for evaluation of cough, nasal congestion, productive sputum for a week.Taking mucinex, singulair and nasocort for symptoms.  Patient reports she had the flu for the last week, and is concerned about the congestion in her chest.  The history is provided by the patient. No language interpreter was used.  Cough Associated symptoms: sore throat   Associated symptoms: no chest pain, no fever, no shortness of breath and no wheezing     Past Medical History:  Diagnosis Date   Endometrial disorder    Endometriosis    Environmental allergies    Gestational diabetes    Gestational hypertension    Headache    Hyperthyroidism    PCOS (polycystic ovarian syndrome)    Tachycardia     Patient Active Problem List   Diagnosis Date Noted   Acute cough 06/24/2024   Acute pharyngitis 06/24/2024   Elevated blood pressure reading 06/24/2024   Adnexal mass 04/11/2024   Calculus of gallbladder without cholecystitis without obstruction 09/03/2021   History of gestational hypertension 11/07/2019   Family history of congenital hydrocephalus 11/07/2019   History of gestational diabetes 05/17/2018   Lactation disorder 05/17/2018   History of paroxysmal atrial tachycardia 02/22/2018   Hypothyroidism 09/27/2017   Endometriosis of pelvis 03/17/2016   Rhinitis, allergic 03/17/2016   Oligo-ovulation 03/17/2016   Acne 03/17/2016   PCOS (polycystic ovarian syndrome) 03/17/2016   Anxiety 03/17/2016    Past Surgical History:  Procedure Laterality Date   CESAREAN SECTION     CHOLECYSTECTOMY     LAPAROSCOPIC ENDOMETRIOSIS FULGURATION     ROBOTIC ASSISTED LAPAROSCOPIC CHOLECYSTECTOMY-MULTI SITE N/A 06/22/2018   Procedure: ROBOTIC  ASSISTED LAPAROSCOPIC CHOLECYSTECTOMY-MULTI SITE;  Surgeon: Jordis Laneta FALCON, MD;  Location: [facility] ORS;  Service: General;  Laterality: N/A;   WISDOM TOOTH EXTRACTION      OB History     Gravida  1   Para  1   Term  1   Preterm      AB      Living  1      SAB      IAB      Ectopic      Multiple      Live Births  1            Home Medications    Prior to Admission medications  Medication Sig Start Date End Date Taking? Authorizing Provider  benzonatate  (TESSALON ) 100 MG capsule Take 1 capsule (100 mg total) by mouth every 8 (eight) hours. 06/24/24  Yes Jaelyne Deeg, NP  cetirizine (ZYRTEC) 10 MG tablet Take 10 mg by mouth daily.   Yes [provider]  doxycycline  (VIBRAMYCIN ) 100 MG capsule Take 1 capsule (100 mg total) by mouth 2 (two) times daily for 7 days. 06/24/24 07/01/24 Yes Sadee Osland, NP  montelukast (SINGULAIR) 10 MG tablet Take 10 mg by mouth at bedtime.   Yes [provider]  Triamcinolone Acetonide (NASACORT AQ NA) Place into the nose.   Yes [provider]  clobetasol ointment (TEMOVATE) 0.05 %  11/24/21   [provider]  Cranberry 50 MG CHEW Chew by mouth.    [provider]  Prenatal  Vit-Fe Fumarate-FA (PRENATAL MULTIVITAMIN) TABS tablet Take 1 tablet by mouth daily at 12 noon. Patient not taking: No sig reported    [provider]  UNABLE TO FIND Take 1 tablet by mouth daily. Med Name: plexus gut health supplement    [provider]  fluticasone (FLONASE) 50 MCG/ACT nasal spray Place 2 sprays into the nose daily as needed for allergies.  09/03/15 06/01/20  [provider]  Norethindrone -Ethinyl Estradiol -Fe Biphas (LO LOESTRIN FE) 1 MG-10 MCG / 10 MCG tablet Take 1 tablet by mouth daily. 11/20/19 05/11/20  Connell Davies, MD  NORLYDA  0.35 MG tablet TAKE 1 TABLET(0.35 MG) BY MOUTH DAILY 10/15/19 05/11/20  Connell Davies, MD    Family History Family History  Problem  Relation Age of Onset   Diabetes Mother    Heart disease Father    Diabetes Paternal Uncle    Diabetes Maternal Grandfather    Diabetes Paternal Grandfather    Heart disease Paternal Grandfather    Breast cancer Neg Hx    Ovarian cancer Neg Hx     Social History Social History[1]   Allergies   Decongest-aid [pseudoephedrine] and Codeine   Review of Systems Review of Systems  Constitutional:  Negative for fever.  HENT:  Positive for congestion and sore throat.   Respiratory:  Positive for cough. Negative for shortness of breath, wheezing and stridor.   Cardiovascular:  Negative for chest pain and palpitations.  All other systems reviewed and are negative.    Physical Exam Triage Vital Signs ED Triage Vitals  Encounter Vitals Group     BP 06/24/24 1358 (!) 152/100     Girls Systolic BP Percentile --      Girls Diastolic BP Percentile --      Boys Systolic BP Percentile --      Boys Diastolic BP Percentile --      Pulse Rate 06/24/24 1358 100     Resp 06/24/24 1358 18     Temp 06/24/24 1358 98.6 F (37 C)     Temp Source 06/24/24 1358 Oral     SpO2 06/24/24 1358 99 %     Weight 06/24/24 1356 180 lb 1.9 oz (81.7 kg)     Height 06/24/24 1356 5' 3 (1.6 m)     Head Circumference --      Peak Flow --      Pain Score 06/24/24 1356 0     Pain Loc --      Pain Education --      Exclude from Growth Chart --    No data found.  Updated Vital Signs BP (!) 152/100 (BP Location: Right Arm)   Pulse 100   Temp 98.6 F (37 C) (Oral)   Resp 18   Ht 5' 3 (1.6 m)   Wt 180 lb 1.9 oz (81.7 kg)   LMP 06/07/2024 (Approximate)   SpO2 99%   BMI 31.91 kg/m   Visual Acuity Right Eye Distance:   Left Eye Distance:   Bilateral Distance:    Right Eye Near:   Left Eye Near:    Bilateral Near:     Physical Exam Vitals and nursing note reviewed.  Constitutional:      General: She is not in acute distress.    Appearance: She is well-developed and well-groomed.  HENT:      Head: Normocephalic.     Right Ear: Tympanic membrane is retracted.     Left Ear: Tympanic membrane is retracted.  Nose: Congestion present.     Mouth/Throat:     Lips: Pink.     Mouth: Mucous membranes are moist.     Pharynx: Oropharynx is clear.  Eyes:     General: Lids are normal.     Conjunctiva/sclera: Conjunctivae normal.     Pupils: Pupils are equal, round, and reactive to light.  Neck:     Trachea: No tracheal deviation.  Cardiovascular:     Rate and Rhythm: Normal rate and regular rhythm.     Heart sounds: Normal heart sounds. No murmur heard. Pulmonary:     Effort: Pulmonary effort is normal.     Breath sounds: Normal breath sounds and air entry.  Abdominal:     General: Bowel sounds are normal.     Palpations: Abdomen is soft.     Tenderness: There is no abdominal tenderness.  Musculoskeletal:        General: Normal range of motion.     Cervical back: Normal range of motion.  Lymphadenopathy:     Cervical: No cervical adenopathy.  Skin:    General: Skin is warm and dry.     Findings: No rash.  Neurological:     General: No focal deficit present.     Mental Status: She is alert and oriented to person, place, and time.     GCS: GCS eye subscore is 4. GCS verbal subscore is 5. GCS motor subscore is 6.  Psychiatric:        Attention and Perception: Attention normal.        Mood and Affect: Mood normal.        Speech: Speech normal.        Behavior: Behavior normal. Behavior is cooperative.      UC Treatments / Results  Labs (all labs ordered are listed, but only abnormal results are displayed) Labs Reviewed  POCT URINE PREGNANCY - Normal    EKG   Radiology DG Chest 2 View Result Date: 06/24/2024 EXAM: 2 VIEW(S) XRAY OF THE CHEST 06/24/2024 02:36:00 PM COMPARISON: None available. CLINICAL HISTORY: cough FINDINGS: LUNGS AND PLEURA: No focal pulmonary opacity. No pleural effusion. No pneumothorax. HEART AND MEDIASTINUM: No acute abnormality of the  cardiac and mediastinal silhouettes. BONES AND SOFT TISSUES: No acute osseous abnormality. IMPRESSION: 1. No acute cardiopulmonary process. Electronically signed by: Morgane Naveau MD 06/24/2024 02:47 PM EST RP Workstation: [id number]    Procedures Procedures (including critical care time)  Medications Ordered in UC Medications - No data to display  Initial Impression / Assessment and Plan / UC Course  I have reviewed the triage vital signs and the nursing notes.  Pertinent labs & imaging results that were available during my care of the patient were reviewed by me and considered in my medical decision making (see chart for details).    Discussed exam findings and plan of care with patient, cxr performed which shows  Increased hilar markings, wet read by provider, will prescribe doxycycline  to cover acute respiratory infection(upper/lower) patient recently had the flu this past week.  Tessalon  for cough, Strict go to ER precautions given, patient verbalized understanding to this provider.  Ddx: Acute respiratory infection, allergies, viral illness, pneumonia Final Clinical Impressions(s) / UC Diagnoses   Final diagnoses:  Acute pharyngitis, unspecified etiology  PCOS (polycystic ovarian syndrome)  Acute cough  Elevated blood pressure reading     Discharge Instructions      We will cover you with doxycycline  for acute respiratory infection.  Wet read by this  provider, please check MyChart for official radiology read.  Take antibiotic as prescribed, Tessalon  for cough, follow-up with PCP.     ED Prescriptions     Medication Sig Dispense Auth. Provider   doxycycline  (VIBRAMYCIN ) 100 MG capsule Take 1 capsule (100 mg total) by mouth 2 (two) times daily for 7 days. 14 capsule Minnetta Sandora, NP   benzonatate  (TESSALON ) 100 MG capsule Take 1 capsule (100 mg total) by mouth every 8 (eight) hours. 21 capsule Treyson Axel, NP      PDMP not reviewed this encounter.      [1]  Social History Tobacco Use   Smoking status: Never   Smokeless tobacco: Never  Vaping Use   Vaping status: Never Used  Substance Use Topics   Alcohol use: Yes    Comment: once a year   Drug use: No     Sydell Prowell, Rilla, NP 06/24/24 1513

## 2024-07-02 DIAGNOSIS — J0100 Acute maxillary sinusitis, unspecified: Secondary | ICD-10-CM | POA: Diagnosis not present

## 2024-07-02 DIAGNOSIS — J069 Acute upper respiratory infection, unspecified: Secondary | ICD-10-CM | POA: Diagnosis not present

## 2024-07-02 DIAGNOSIS — J9801 Acute bronchospasm: Secondary | ICD-10-CM | POA: Diagnosis not present

## 2024-07-19 NOTE — Progress Notes (Signed)
 I discussed the Tumor Board recommendations with her and recommended the CT to follow up on the Tumor recommendations. She has a follow scheduled at [facility] Feb 2026 with repeat CA125. I reached out to Rayfield Jasmine RN to schedule the CT at [facility].   Consensus recommendations: Recommend CT imaging to evaluate for residual disease (none visualized intra-operatively). If no evidence of disease, recommend observation. If residual disease suspected, can consider restaging surgery pending completion of childbearing (presence or absence of micropapillary features or other high-risk histology patterns may assist with decision-making).   ANGELES ISIDOR CONSTABLE, MD

## 2024-07-20 ENCOUNTER — Other Ambulatory Visit: Payer: Self-pay

## 2024-07-20 DIAGNOSIS — D3910 Neoplasm of uncertain behavior of unspecified ovary: Secondary | ICD-10-CM

## 2024-07-23 ENCOUNTER — Telehealth: Payer: Self-pay | Admitting: Obstetrics and Gynecology

## 2024-07-23 ENCOUNTER — Encounter: Payer: Self-pay | Admitting: Obstetrics and Gynecology

## 2024-07-23 NOTE — Telephone Encounter (Signed)
 Called pt to add lab prior to CT for 2/4 per Kristy - left vm for call back - LH

## 2024-07-24 ENCOUNTER — Encounter: Payer: Self-pay | Admitting: Obstetrics and Gynecology

## 2024-07-24 ENCOUNTER — Telehealth: Payer: Self-pay | Admitting: Obstetrics and Gynecology

## 2024-07-24 NOTE — Telephone Encounter (Signed)
 Pt called to r/s lab to before CT instead of before MD appt - pt confirmed new date/time - LH

## 2024-08-09 ENCOUNTER — Telehealth: Payer: Self-pay | Admitting: Obstetrics and Gynecology

## 2024-08-09 NOTE — Telephone Encounter (Signed)
 Called pt to confirm CT and lab appt - [facility]

## 2024-08-15 ENCOUNTER — Ambulatory Visit
Admission: RE | Admit: 2024-08-15 | Discharge: 2024-08-15 | Disposition: A | Source: Ambulatory Visit | Attending: Obstetrics and Gynecology | Admitting: Obstetrics and Gynecology

## 2024-08-15 ENCOUNTER — Inpatient Hospital Stay: Attending: Obstetrics and Gynecology

## 2024-08-15 DIAGNOSIS — D3910 Neoplasm of uncertain behavior of unspecified ovary: Secondary | ICD-10-CM

## 2024-08-15 MED ORDER — IOHEXOL 300 MG/ML  SOLN
100.0000 mL | Freq: Once | INTRAMUSCULAR | Status: AC | PRN
  Administered 2024-08-15: 100 mL via INTRAVENOUS

## 2024-08-16 LAB — CA 125: Cancer Antigen (CA) 125: 10.9 U/mL (ref 0.0–38.1)

## 2024-08-29 ENCOUNTER — Inpatient Hospital Stay
